# Patient Record
Sex: Male | Born: 1945
Health system: Southern US, Community
[De-identification: ages and names within clinical notes are randomized; demographics above are authoritative.]

## PROBLEM LIST (undated history)

## (undated) DIAGNOSIS — I6529 Occlusion and stenosis of unspecified carotid artery: Secondary | ICD-10-CM

## (undated) DIAGNOSIS — N400 Enlarged prostate without lower urinary tract symptoms: Secondary | ICD-10-CM

## (undated) DIAGNOSIS — E785 Hyperlipidemia, unspecified: Secondary | ICD-10-CM

## (undated) DIAGNOSIS — G4733 Obstructive sleep apnea (adult) (pediatric): Secondary | ICD-10-CM

## (undated) DIAGNOSIS — I2510 Atherosclerotic heart disease of native coronary artery without angina pectoris: Secondary | ICD-10-CM

## (undated) DIAGNOSIS — Z955 Presence of coronary angioplasty implant and graft: Secondary | ICD-10-CM

## (undated) DIAGNOSIS — I219 Acute myocardial infarction, unspecified: Secondary | ICD-10-CM

## (undated) HISTORY — DX: Atherosclerotic heart disease of native coronary artery without angina pectoris: I25.10

## (undated) HISTORY — DX: Presence of coronary angioplasty implant and graft: Z95.5

## (undated) HISTORY — DX: Acute myocardial infarction, unspecified: I21.9

## (undated) HISTORY — DX: Hyperlipidemia, unspecified: E78.5

## (undated) HISTORY — DX: Occlusion and stenosis of unspecified carotid artery: I65.29

## (undated) HISTORY — DX: Obstructive sleep apnea (adult) (pediatric): G47.33

## (undated) HISTORY — DX: Benign prostatic hyperplasia without lower urinary tract symptoms: N40.0

## (undated) HISTORY — PX: HERNIA REPAIR: SHX51

## (undated) HISTORY — PX: KNEE SURGERY: SHX244

---

## 2013-04-13 DIAGNOSIS — I219 Acute myocardial infarction, unspecified: Secondary | ICD-10-CM

## 2013-04-13 HISTORY — DX: Acute myocardial infarction, unspecified: I21.9

## 2013-09-03 LAB — ABI

## 2015-03-22 DIAGNOSIS — I739 Peripheral vascular disease, unspecified: Secondary | ICD-10-CM | POA: Diagnosis not present

## 2015-03-22 DIAGNOSIS — T81718A Complication of other artery following a procedure, not elsewhere classified, initial encounter: Secondary | ICD-10-CM | POA: Diagnosis not present

## 2015-03-22 DIAGNOSIS — S75001A Unspecified injury of femoral artery, right leg, initial encounter: Secondary | ICD-10-CM | POA: Diagnosis not present

## 2015-11-02 ENCOUNTER — Encounter: Payer: Self-pay | Admitting: Cardiovascular Disease

## 2015-11-02 ENCOUNTER — Telehealth: Payer: Self-pay | Admitting: Cardiovascular Disease

## 2015-11-02 NOTE — Telephone Encounter (Signed)
Called pt and left message asking pt to call back to update his Fm and medical Hx.

## 2015-11-03 ENCOUNTER — Telehealth: Payer: Self-pay | Admitting: Cardiovascular Disease

## 2015-11-03 ENCOUNTER — Encounter: Payer: Self-pay | Admitting: Cardiovascular Disease

## 2015-11-03 NOTE — Telephone Encounter (Signed)
Marcus Parsons is returning a call to a  Vicenta Aly ,Please call

## 2015-11-09 NOTE — Progress Notes (Signed)
Cardiology Office Note   Date:  11/15/2015   ID:  Jabin, Kalich 05/30/1945, MRN [id number]  PCP:  No PCP Per Patient  Cardiologist:   Jenkins Rouge, MD   Chief Complaint  Patient presents with  . Establish Care    just moved & needed to establish with cardio doc, per pt      History of Present Illness: Marcus Parsons is a 70 y.o. male who presents for evaluation of CAD Had STEMI in 2015 with IMI.   CRF;s elevated lipids , HTN and family history. Maternal uncle with CABG   Wears CPAP for OSA.    Reviewed over 50 pages of records from [facility] in [place].   Notes indicate single DES to mid RCA 3.5 x 12 mm Resolute  No significant dx in circumflex or LAD   EF preserved 60% 05/08/13    Was supposed to have myovue in June but never done Having some exertional dyspnea which was part of his original presentation along with nausea  Retiring from [employer] to travel a lot. Father was an alcoholic but he drinks less Now that he's not working. Moved her to be close to only daughter and grand son who  Is 4 named Marcus Parsons.  Happily married for 47 years Wifes health ok.  Originally from  [place] lived mostly in [place] before going to [place] for work   Some chronic pain in [place]FA area since cath Duplex with normal circulation and no pathology ABI 1 on right and .94 on left   Past Medical History:  Diagnosis Date  . Acute myocardial infarction 04/2013   INFERIOR  . Atherosclerosis of coronary artery   . Benign prostatic hypertrophy   . Carotid stenosis   . Hyperlipidemia   . Obstructive sleep apnea   . S/P coronary artery stent placement     Past Surgical History:  Procedure Laterality Date  . HERNIA REPAIR    . KNEE SURGERY     AS CHILD     Current Outpatient Prescriptions  Medication Sig Dispense Refill  . aspirin EC 81 MG tablet Take 81 mg by mouth daily.    Marland Kitchen atorvastatin (LIPITOR) 80 MG tablet Take 1 tablet (80 mg total) by mouth at  bedtime. 90 tablet 3  . dutasteride (AVODART) 0.5 MG capsule Take 1 capsule (0.5 mg total) by mouth daily. 90 capsule 3  . nitroGLYCERIN (NITROSTAT) 0.4 MG SL tablet Place 1 tablet (0.4 mg total) under the tongue every 5 (five) minutes as needed for chest pain. 25 tablet 2  . olmesartan (BENICAR) 40 MG tablet Take 0.5 tablets (20 mg total) by mouth daily. 45 tablet 3  . Probiotic Product (PROBIOTIC DAILY) CAPS Take 1 capsule by mouth daily. 90 capsule 3   No current facility-administered medications for this visit.     Allergies:   Review of patient's allergies indicates no known allergies.    Social History:  The patient  reports that he has never smoked. He has never used smokeless tobacco. He reports that he does not drink alcohol or use drugs.   Family History:  The patient's family history includes CAD in his paternal uncle; Diabetes Mellitus I in his father; Heart disease in his paternal uncle; Hypertension in his father; Leukemia in his maternal uncle; Lung cancer in his father.    ROS:  Please see the history of present illness.   Otherwise, review of systems are positive for none.   All other  systems are reviewed and negative.    PHYSICAL EXAM: VS:  BP 126/80   Pulse 79   Ht 6' (1.829 m)   Wt 113.9 kg (251 lb 1.9 oz)   SpO2 97%   BMI 34.06 kg/m  , BMI Body mass index is 34.06 kg/m. Affect appropriate Healthy:  appears stated age 78: normal Neck supple with no adenopathy JVP normal no bruits no thyromegaly Lungs clear with no wheezing and good diaphragmatic motion Heart:  S1/S2 no murmur, no rub, gallop or click PMI normal Abdomen: benighn, BS positve, no tenderness, no AAA no bruit.  No HSM or HJR Distal pulses intact with no bruits No edema Neuro non-focal Skin warm and dry No muscular weakness    EKG:   03/03/15 SR old IMI with Q and inverted T wave 2,3,F  11/15/15 SR rate 70 old IMI no change   Recent Labs: No results found for requested labs within  last 8760 hours.    Lipid Panel No results found for: CHOL, TRIG, HDL, CHOLHDL, VLDL, LDLCALC, LDLDIRECT    Wt Readings from Last 3 Encounters:  11/15/15 113.9 kg (251 lb 1.9 oz)      Other studies Reviewed: Additional studies/ records that were reviewed today include: Records from [place] including echo , cath and labs .    ASSESSMENT AND PLAN:  1.  CAD: f/u myovue given dyspnea continue ASA stop Brillinta 2. Chol: continue statin labs with primary  3. Carotid  No bruit on exam follow  4. BPH:  Refill on avodart called in  5. Leg pain: normal duplex no bruits not clear if lateral femoral cutaneous nerve Was injured during cath for IMI    Current medicines are reviewed at length with the patient today.  The patient does not have concerns regarding medicines.  The following changes have been made:  Stop Brillinta  Labs/ tests ordered today include: Myovue   Orders Placed This Encounter  Procedures  . Myocardial Perfusion Imaging  . EKG 12-Lead     Disposition:   FU with me in 6 months      Signed, Jenkins Rouge, MD  11/15/2015 10:31 AM    [facility] [place], [place], [place]  [address] Phone: [phone]; Fax: [phone]

## 2015-11-15 ENCOUNTER — Encounter (INDEPENDENT_AMBULATORY_CARE_PROVIDER_SITE_OTHER): Payer: Self-pay

## 2015-11-15 ENCOUNTER — Ambulatory Visit (INDEPENDENT_AMBULATORY_CARE_PROVIDER_SITE_OTHER): Payer: Medicare Other | Admitting: Cardiovascular Disease

## 2015-11-15 ENCOUNTER — Encounter: Payer: Self-pay | Admitting: Cardiovascular Disease

## 2015-11-15 VITALS — BP 126/80 | HR 79 | Ht 72.0 in | Wt 251.1 lb

## 2015-11-15 DIAGNOSIS — I2119 ST elevation (STEMI) myocardial infarction involving other coronary artery of inferior wall: Secondary | ICD-10-CM

## 2015-11-15 DIAGNOSIS — Z7689 Persons encountering health services in other specified circumstances: Secondary | ICD-10-CM | POA: Diagnosis not present

## 2015-11-15 MED ORDER — OLMESARTAN MEDOXOMIL 40 MG PO TABS: 20.0000 mg | ORAL_TABLET | Freq: Every day | ORAL | 3 refills | Status: DC

## 2015-11-15 MED ORDER — PROBIOTIC DAILY PO CAPS: 1.0000 | ORAL_CAPSULE | Freq: Every day | ORAL | 3 refills | Status: AC

## 2015-11-15 MED ORDER — ATORVASTATIN CALCIUM 80 MG PO TABS: 80.0000 mg | ORAL_TABLET | Freq: Every day | ORAL | 3 refills | Status: DC

## 2015-11-15 MED ORDER — DUTASTERIDE 0.5 MG PO CAPS: 0.5000 mg | ORAL_CAPSULE | Freq: Every day | ORAL | 3 refills | Status: DC

## 2015-11-15 MED ORDER — NITROGLYCERIN 0.4 MG SL SUBL: 0.4000 mg | SUBLINGUAL_TABLET | SUBLINGUAL | 2 refills | Status: DC | PRN

## 2015-11-15 NOTE — Patient Instructions (Addendum)
Medication Instructions:  Your physician has recommended you make the following change in your medication:  1-STOP Brilinta  Labwork: NONE  Testing/Procedures: Your physician has requested that you have en exercise stress myoview. For further information please visit [email or web address]. Please follow instruction sheet, as given.  Follow-Up: Your physician wants you to follow-up in: 6 months with Dr. Johnsie Cancel. You will receive a reminder letter in the mail two months in advance. If you don't receive a letter, please call our office to schedule the follow-up appointment.   If you need a refill on your cardiac medications before your next appointment, please call your pharmacy.

## 2015-11-18 ENCOUNTER — Ambulatory Visit (HOSPITAL_COMMUNITY): Payer: Medicare Other | Attending: Cardiovascular Disease

## 2015-11-18 DIAGNOSIS — Z7689 Persons encountering health services in other specified circumstances: Secondary | ICD-10-CM | POA: Diagnosis not present

## 2015-11-18 DIAGNOSIS — I2119 ST elevation (STEMI) myocardial infarction involving other coronary artery of inferior wall: Secondary | ICD-10-CM

## 2015-11-18 DIAGNOSIS — I252 Old myocardial infarction: Secondary | ICD-10-CM | POA: Diagnosis not present

## 2015-11-18 DIAGNOSIS — R9439 Abnormal result of other cardiovascular function study: Secondary | ICD-10-CM | POA: Diagnosis not present

## 2015-11-18 LAB — MYOCARDIAL PERFUSION IMAGING
CHL CUP MPHR: 153 {beats}/min
CHL CUP NUCLEAR SDS: 8
CHL CUP NUCLEAR SSS: 17
CSEPED: 7 min
CSEPEW: 7.1 METS
Exercise duration (sec): 10 s
LHR: 0.31
LV sys vol: 49 mL
LVDIAVOL: 113 mL (ref 62–150)
NUC STRESS TID: 0.95
Peak HR: 139 {beats}/min
Percent HR: 90 %
Rest HR: 61 {beats}/min
SRS: 13

## 2015-11-18 MED ORDER — TECHNETIUM TC 99M TETROFOSMIN IV KIT
11.0000 | PACK | Freq: Once | INTRAVENOUS | Status: AC | PRN
  Administered 2015-11-18: 11 via INTRAVENOUS
  Filled 2015-11-18: qty 11

## 2015-11-18 MED ORDER — TECHNETIUM TC 99M TETROFOSMIN IV KIT
32.4000 | PACK | Freq: Once | INTRAVENOUS | Status: AC | PRN
  Administered 2015-11-18: 32 via INTRAVENOUS
  Filled 2015-11-18: qty 32

## 2015-11-22 DIAGNOSIS — Z23 Encounter for immunization: Secondary | ICD-10-CM | POA: Diagnosis not present

## 2016-04-13 DIAGNOSIS — E785 Hyperlipidemia, unspecified: Secondary | ICD-10-CM | POA: Diagnosis not present

## 2016-04-13 DIAGNOSIS — Z0000 Encounter for general adult medical examination without abnormal findings: Secondary | ICD-10-CM | POA: Diagnosis not present

## 2016-04-13 DIAGNOSIS — G4733 Obstructive sleep apnea (adult) (pediatric): Secondary | ICD-10-CM | POA: Diagnosis not present

## 2016-04-13 DIAGNOSIS — Z79899 Other long term (current) drug therapy: Secondary | ICD-10-CM | POA: Diagnosis not present

## 2016-04-13 DIAGNOSIS — I10 Essential (primary) hypertension: Secondary | ICD-10-CM | POA: Diagnosis not present

## 2016-04-13 DIAGNOSIS — I2510 Atherosclerotic heart disease of native coronary artery without angina pectoris: Secondary | ICD-10-CM | POA: Diagnosis not present

## 2016-04-13 DIAGNOSIS — N401 Enlarged prostate with lower urinary tract symptoms: Secondary | ICD-10-CM | POA: Diagnosis not present

## 2016-04-13 DIAGNOSIS — R972 Elevated prostate specific antigen [PSA]: Secondary | ICD-10-CM | POA: Diagnosis not present

## 2016-04-17 DIAGNOSIS — R972 Elevated prostate specific antigen [PSA]: Secondary | ICD-10-CM | POA: Diagnosis not present

## 2016-04-17 DIAGNOSIS — E785 Hyperlipidemia, unspecified: Secondary | ICD-10-CM | POA: Diagnosis not present

## 2016-04-17 DIAGNOSIS — Z79899 Other long term (current) drug therapy: Secondary | ICD-10-CM | POA: Diagnosis not present

## 2016-04-17 DIAGNOSIS — Z0000 Encounter for general adult medical examination without abnormal findings: Secondary | ICD-10-CM | POA: Diagnosis not present

## 2016-04-24 DIAGNOSIS — R7301 Impaired fasting glucose: Secondary | ICD-10-CM | POA: Diagnosis not present

## 2016-05-17 NOTE — Progress Notes (Signed)
Cardiology Office Note    Date:  05/19/2016   ID:  Marcus, Parsons [birth year], MRN [id number]  PCP:  Eloise Levels, NP  Cardiologist:   Jenkins Rouge, MD   Chief Complaint  Patient presents with  . Follow up to MI      History of Present Illness: Marcus Parsons is a 71 y.o. male who presents for evaluation of CAD Had STEMI in 2015 with IMI.   CRF;s elevated lipids , HTN and family history. Maternal uncle with CABG   Wears CPAP for OSA.    Reviewed over 50 pages of records from [facility] in [place].   Notes indicate single DES to mid RCA 3.5 x 12 mm Resolute  No significant dx in circumflex or LAD  EF preserved 60% 05/08/13    Exertional dyspnea and  Nausea were part of original presentation   Retiring from [employer] Use to travel a lot. Father was an alcoholic but he drinks less Now that he's not working. Moved her to be close to only daughter and grand son who  Is 4.5  named Marcus Parsons.  Happily married for 47 years Wifes health ok.  Originally from  [place] lived mostly in [place] before going to [place] for work   Some chronic pain in [place] area since cath Duplex with normal circulation and no pathology ABI 1 on right and .94 on left   Last myovue reviewed: 11/18/15 EF 57% small inferior lateral wall MI no ischemia Low risk   Sees Eloise Levels with [facility] labs good PSA around 7 but was as high as 30 Biopsies have been negative   Past Medical History:  Diagnosis Date  . Acute myocardial infarction 04/2013   INFERIOR  . Atherosclerosis of coronary artery   . Benign prostatic hypertrophy   . Carotid stenosis   . Hyperlipidemia   . Obstructive sleep apnea   . S/P coronary artery stent placement     Past Surgical History:  Procedure Laterality Date  . HERNIA REPAIR    . KNEE SURGERY     AS CHILD     Current Outpatient Prescriptions  Medication Sig Dispense Refill  . aspirin EC 81 MG tablet Take 81 mg by mouth daily.    Marland Kitchen  atorvastatin (LIPITOR) 80 MG tablet Take 1 tablet (80 mg total) by mouth at bedtime. 90 tablet 3  . dutasteride (AVODART) 0.5 MG capsule Take 1 capsule (0.5 mg total) by mouth daily. 90 capsule 3  . nitroGLYCERIN (NITROSTAT) 0.4 MG SL tablet Place 1 tablet (0.4 mg total) under the tongue every 5 (five) minutes as needed for chest pain. 25 tablet 2  . olmesartan (BENICAR) 40 MG tablet Take 0.5 tablets (20 mg total) by mouth daily. 45 tablet 3  . Probiotic Product (PROBIOTIC DAILY) CAPS Take 1 capsule by mouth daily. 90 capsule 3   No current facility-administered medications for this visit.     Allergies:   Patient has no known allergies.    Social History:  The patient  reports that he has never smoked. He has never used smokeless tobacco. He reports that he does not drink alcohol or use drugs.   Family History:  The patient's family history includes CAD in his paternal uncle; Diabetes Mellitus I in his father; Heart disease in his paternal uncle; Hypertension in his father; Leukemia in his maternal uncle; Lung cancer in his father.    ROS:  Please see the history of present illness.  Otherwise, review of systems are positive for none.   All other systems are reviewed and negative.    PHYSICAL EXAM: VS:  BP 130/76   Pulse 82   Ht 6' (1.829 m)   Wt 263 lb 12.8 oz (119.7 kg)   SpO2 97%   BMI 35.78 kg/m  , BMI Body mass index is 35.78 kg/m. Affect appropriate Healthy:  appears stated age 47: normal Neck supple with no adenopathy JVP normal no bruits no thyromegaly Lungs clear with no wheezing and good diaphragmatic motion Heart:  S1/S2 no murmur, no rub, gallop or click PMI normal Abdomen: benighn, BS positve, no tenderness, no AAA no bruit.  No HSM or HJR Distal pulses intact with no bruits No edema Neuro non-focal Skin warm and dry No muscular weakness    EKG:   03/03/15 SR old IMI with Q and inverted T wave 2,3,F  11/15/15 SR rate 55 old IMI no change   Recent  Labs: No results found for requested labs within last 8760 hours.    Lipid Panel No results found for: CHOL, TRIG, HDL, CHOLHDL, VLDL, LDLCALC, LDLDIRECT    Wt Readings from Last 3 Encounters:  05/19/16 263 lb 12.8 oz (119.7 kg)  11/18/15 251 lb (113.9 kg)  11/15/15 251 lb 1.9 oz (113.9 kg)      Other studies Reviewed: Additional studies/ records that were reviewed today include: Records from [place] including echo , cath and labs .    ASSESSMENT AND PLAN:  1.  CAD: Continue ASA/Statin non ischemic myovue 11/18/15 2. Chol: continue statin labs with primary  3. Carotid  No bruit on exam follow  4. BPH:  Refill on avodart called in PSA around 7 chronrically has had biopsy and ok  5. Leg pain: normal duplex no bruits not clear if lateral femoral cutaneous nerve Was injured during cath for IMI    Current medicines are reviewed at length with the patient today.  The patient does not have concerns regarding medicines.  The following changes have been made:     Labs/ tests ordered today include:     No orders of the defined types were placed in this encounter.    Disposition:   FU with me in 6 months      Signed, Jenkins Rouge, MD  05/19/2016 9:02 AM    [facility] [place], [place], [place]  [address] Phone: [phone]; Fax: [phone]

## 2016-05-19 ENCOUNTER — Ambulatory Visit (INDEPENDENT_AMBULATORY_CARE_PROVIDER_SITE_OTHER): Payer: Medicare Other | Admitting: Cardiovascular Disease

## 2016-05-19 ENCOUNTER — Encounter: Payer: Self-pay | Admitting: Cardiovascular Disease

## 2016-05-19 VITALS — BP 130/76 | HR 82 | Ht 72.0 in | Wt 263.8 lb

## 2016-05-19 DIAGNOSIS — I2119 ST elevation (STEMI) myocardial infarction involving other coronary artery of inferior wall: Secondary | ICD-10-CM | POA: Diagnosis not present

## 2016-05-19 NOTE — Patient Instructions (Signed)

## 2016-07-19 DIAGNOSIS — K635 Polyp of colon: Secondary | ICD-10-CM | POA: Diagnosis not present

## 2016-07-19 DIAGNOSIS — Z86010 Personal history of colonic polyps: Secondary | ICD-10-CM | POA: Diagnosis not present

## 2016-07-25 DIAGNOSIS — K635 Polyp of colon: Secondary | ICD-10-CM | POA: Diagnosis not present

## 2016-11-30 DIAGNOSIS — Z23 Encounter for immunization: Secondary | ICD-10-CM | POA: Diagnosis not present

## 2017-01-11 ENCOUNTER — Other Ambulatory Visit: Payer: Self-pay | Admitting: Cardiovascular Disease

## 2017-05-29 NOTE — Progress Notes (Signed)
Cardiology Office Note    Date:  05/31/2017   ID:  Marcus Parsons, Marcus Parsons [birth year], MRN [id number]  PCP:  Marcus Levels, NP (Inactive)  Cardiologist:   Marcus Rouge, MD   No chief complaint on file.     History of Present Illness:  72 y.o. f/u CAD.  STEMI 2015 DES to mid RCA 3.5 mm x 12 mm Resolute done in [place]. EF 60% Presented with dyspnea and nausea as much as any chest pain Retired from [employer] moved here to be near daughter and grandson Marcus Parsons. Originally from [place]. Has some chronic LLE pain since cath ? Lateral femoral cutaneous nerve injury. ABI's have been normal   Last myovue reviewed: 11/18/15 EF 57% small inferior lateral wall MI no ischemia Low risk   Sees Marcus Parsons with [facility] labs good PSA around 7 but was as high as 30 Biopsies have been negative   Prostate is enlarged and he needs urology f/u No cardiac complaints   Past Medical History:  Diagnosis Date  . Acute myocardial infarction ([place]) 04/2013   INFERIOR  . Atherosclerosis of coronary artery   . Benign prostatic hypertrophy   . Carotid stenosis   . Hyperlipidemia   . Obstructive sleep apnea   . S/P coronary artery stent placement     Past Surgical History:  Procedure Laterality Date  . HERNIA REPAIR    . KNEE SURGERY     AS CHILD     Current Outpatient Medications  Medication Sig Dispense Refill  . aspirin EC 81 MG tablet Take 81 mg by mouth daily.    Marland Kitchen atorvastatin (LIPITOR) 80 MG tablet TAKE 1 TABLET AT BEDTIME 90 tablet 1  . dutasteride (AVODART) 0.5 MG capsule Take 1 capsule (0.5 mg total) by mouth daily. 90 capsule 1  . nitroGLYCERIN (NITROSTAT) 0.4 MG SL tablet Place 1 tablet (0.4 mg total) under the tongue every 5 (five) minutes as needed for chest pain. 25 tablet 2  . olmesartan (BENICAR) 40 MG tablet TAKE 1/2 TABLET EVERY DAY 45 tablet 1  . Probiotic Product (PROBIOTIC DAILY) CAPS Take 1 capsule by mouth daily. 90 capsule 3   No current facility-administered  medications for this visit.     Allergies:   Patient has no known allergies.    Social History:  The patient  reports that he has never smoked. He has never used smokeless tobacco. He reports that he does not drink alcohol or use drugs.   Family History:  The patient's family history includes CAD in his paternal uncle; Diabetes Mellitus I in his father; Heart disease in his paternal uncle; Hypertension in his father; Leukemia in his maternal uncle; Lung cancer in his father.    ROS:  Please see the history of present illness.   Otherwise, review of systems are positive for none.   All other systems are reviewed and negative.    PHYSICAL EXAM: VS:  BP 126/68   Pulse 81   Ht 6' (1.829 m)   Wt 245 lb 8 oz (111.4 kg)   SpO2 95%   BMI 33.30 kg/m  , BMI Body mass index is 33.3 kg/m. Affect appropriate Healthy:  appears stated age 14: normal Neck supple with no adenopathy JVP normal no bruits no thyromegaly Lungs clear with no wheezing and good diaphragmatic motion Heart:  S1/S2 no murmur, no rub, gallop or click PMI normal Abdomen: benighn, BS positve, no tenderness, no AAA no bruit.  No HSM or HJR Distal  pulses intact with no bruits No edema Neuro non-focal Skin warm and dry No muscular weakness     EKG:   03/03/15 SR old IMI with Q and inverted T wave 2,3,F  11/15/15 SR rate 23 old IMI no change   Recent Labs: No results found for requested labs within last 8760 hours.    Lipid Panel No results found for: CHOL, TRIG, HDL, CHOLHDL, VLDL, LDLCALC, LDLDIRECT    Wt Readings from Last 3 Encounters:  05/31/17 245 lb 8 oz (111.4 kg)  05/19/16 263 lb 12.8 oz (119.7 kg)  11/18/15 251 lb (113.9 kg)      Other studies Reviewed: Additional studies/ records that were reviewed today include: Records from [place] including echo , cath and labs .    ASSESSMENT AND PLAN:  1. CAD: Continue ASA/Statin non ischemic myovue 11/18/15 DES mid RCA 2015  2.  Chol: continue statin labs with primary  3. Carotid  No bruit on exam follow  4. BPH:  Refill on avodart called in PSA around 7 chronrically has had biopsy and ok  Needs urology f/u will refer to [facility]  5. Leg pain: normal duplex no bruits not clear if lateral femoral cutaneous nerve Was injured during cath for IMI  6. HTN:  Continue ARB low sodium diet    Current medicines are reviewed at length with the patient today.  The patient does not have concerns regarding medicines.  The following changes have been made:     Labs/ tests ordered today include:     Orders Placed This Encounter  Procedures  . Ambulatory referral to Urology     Disposition:   FU with me in 6 months      Signed, Marcus Rouge, MD  05/31/2017 10:45 AM    [facility] [place], [place], [place]  [address] Phone: [phone]; Fax: [phone]

## 2017-05-31 ENCOUNTER — Encounter: Payer: Self-pay | Admitting: Cardiovascular Disease

## 2017-05-31 ENCOUNTER — Ambulatory Visit (INDEPENDENT_AMBULATORY_CARE_PROVIDER_SITE_OTHER): Payer: Medicare Other | Admitting: Cardiovascular Disease

## 2017-05-31 VITALS — BP 126/68 | HR 81 | Ht 72.0 in | Wt 245.5 lb

## 2017-05-31 DIAGNOSIS — R972 Elevated prostate specific antigen [PSA]: Secondary | ICD-10-CM | POA: Diagnosis not present

## 2017-05-31 DIAGNOSIS — I2510 Atherosclerotic heart disease of native coronary artery without angina pectoris: Secondary | ICD-10-CM | POA: Diagnosis not present

## 2017-05-31 NOTE — Patient Instructions (Signed)
Medication Instructions:  Your physician recommends that you continue on your current medications as directed. Please refer to the Current Medication list given to you today.  Lab work: NONE  Testing/Procedures: NONE  Follow-Up: Your physician wants you to follow-up in: 12 months with Dr. Johnsie Cancel. You will receive a reminder letter in the mail two months in advance. If you don't receive a letter, please call our office to schedule the follow-up appointment.  You have been referred to Dr. Louis Meckel with [facility]   If you need a refill on your cardiac medications before your next appointment, please call your pharmacy.

## 2017-07-02 DIAGNOSIS — R972 Elevated prostate specific antigen [PSA]: Secondary | ICD-10-CM | POA: Diagnosis not present

## 2017-07-02 DIAGNOSIS — N5201 Erectile dysfunction due to arterial insufficiency: Secondary | ICD-10-CM | POA: Diagnosis not present

## 2017-07-13 DIAGNOSIS — N401 Enlarged prostate with lower urinary tract symptoms: Secondary | ICD-10-CM | POA: Diagnosis not present

## 2017-07-13 DIAGNOSIS — I10 Essential (primary) hypertension: Secondary | ICD-10-CM | POA: Diagnosis not present

## 2017-07-13 DIAGNOSIS — I2510 Atherosclerotic heart disease of native coronary artery without angina pectoris: Secondary | ICD-10-CM | POA: Diagnosis not present

## 2017-07-13 DIAGNOSIS — E7800 Pure hypercholesterolemia, unspecified: Secondary | ICD-10-CM | POA: Diagnosis not present

## 2017-07-13 DIAGNOSIS — M6208 Separation of muscle (nontraumatic), other site: Secondary | ICD-10-CM | POA: Diagnosis not present

## 2017-07-13 DIAGNOSIS — R7303 Prediabetes: Secondary | ICD-10-CM | POA: Diagnosis not present

## 2017-07-13 DIAGNOSIS — Z79899 Other long term (current) drug therapy: Secondary | ICD-10-CM | POA: Diagnosis not present

## 2017-09-11 ENCOUNTER — Other Ambulatory Visit: Payer: Self-pay | Admitting: Cardiovascular Disease

## 2017-10-29 ENCOUNTER — Other Ambulatory Visit: Payer: Self-pay | Admitting: Cardiovascular Disease

## 2017-11-15 DIAGNOSIS — Z23 Encounter for immunization: Secondary | ICD-10-CM | POA: Diagnosis not present

## 2017-11-30 DIAGNOSIS — H2513 Age-related nuclear cataract, bilateral: Secondary | ICD-10-CM | POA: Diagnosis not present

## 2017-12-24 DIAGNOSIS — R972 Elevated prostate specific antigen [PSA]: Secondary | ICD-10-CM | POA: Diagnosis not present

## 2017-12-31 DIAGNOSIS — R972 Elevated prostate specific antigen [PSA]: Secondary | ICD-10-CM | POA: Diagnosis not present

## 2017-12-31 DIAGNOSIS — R350 Frequency of micturition: Secondary | ICD-10-CM | POA: Diagnosis not present

## 2017-12-31 DIAGNOSIS — N5201 Erectile dysfunction due to arterial insufficiency: Secondary | ICD-10-CM | POA: Diagnosis not present

## 2018-01-18 DIAGNOSIS — I10 Essential (primary) hypertension: Secondary | ICD-10-CM | POA: Diagnosis not present

## 2018-01-18 DIAGNOSIS — N401 Enlarged prostate with lower urinary tract symptoms: Secondary | ICD-10-CM | POA: Diagnosis not present

## 2018-01-18 DIAGNOSIS — R1031 Right lower quadrant pain: Secondary | ICD-10-CM | POA: Diagnosis not present

## 2018-01-18 DIAGNOSIS — I2510 Atherosclerotic heart disease of native coronary artery without angina pectoris: Secondary | ICD-10-CM | POA: Diagnosis not present

## 2018-01-18 DIAGNOSIS — R7303 Prediabetes: Secondary | ICD-10-CM | POA: Diagnosis not present

## 2018-01-18 DIAGNOSIS — Z0000 Encounter for general adult medical examination without abnormal findings: Secondary | ICD-10-CM | POA: Diagnosis not present

## 2018-01-18 DIAGNOSIS — Z79899 Other long term (current) drug therapy: Secondary | ICD-10-CM | POA: Diagnosis not present

## 2018-02-11 DIAGNOSIS — G4733 Obstructive sleep apnea (adult) (pediatric): Secondary | ICD-10-CM | POA: Diagnosis not present

## 2018-03-01 DIAGNOSIS — G4733 Obstructive sleep apnea (adult) (pediatric): Secondary | ICD-10-CM | POA: Diagnosis not present

## 2018-04-17 DIAGNOSIS — G4733 Obstructive sleep apnea (adult) (pediatric): Secondary | ICD-10-CM | POA: Diagnosis not present

## 2018-04-23 ENCOUNTER — Other Ambulatory Visit: Payer: Self-pay

## 2018-04-23 ENCOUNTER — Encounter (HOSPITAL_COMMUNITY): Payer: Self-pay | Admitting: Pharmacy Technician

## 2018-04-23 ENCOUNTER — Inpatient Hospital Stay (HOSPITAL_COMMUNITY)
Admission: EM | Admit: 2018-04-23 | Discharge: 2018-04-25 | DRG: 282 | Disposition: A | Discharge status: Home / Self Care | Payer: Medicare Other | Attending: Cardiovascular Disease | Admitting: Cardiovascular Disease

## 2018-04-23 ENCOUNTER — Emergency Department (HOSPITAL_COMMUNITY): Payer: Medicare Other

## 2018-04-23 DIAGNOSIS — I2510 Atherosclerotic heart disease of native coronary artery without angina pectoris: Secondary | ICD-10-CM | POA: Diagnosis not present

## 2018-04-23 DIAGNOSIS — Z955 Presence of coronary angioplasty implant and graft: Secondary | ICD-10-CM

## 2018-04-23 DIAGNOSIS — R079 Chest pain, unspecified: Secondary | ICD-10-CM | POA: Diagnosis not present

## 2018-04-23 DIAGNOSIS — Z8249 Family history of ischemic heart disease and other diseases of the circulatory system: Secondary | ICD-10-CM

## 2018-04-23 DIAGNOSIS — G4733 Obstructive sleep apnea (adult) (pediatric): Secondary | ICD-10-CM | POA: Diagnosis present

## 2018-04-23 DIAGNOSIS — E785 Hyperlipidemia, unspecified: Secondary | ICD-10-CM

## 2018-04-23 DIAGNOSIS — Z79899 Other long term (current) drug therapy: Secondary | ICD-10-CM | POA: Diagnosis not present

## 2018-04-23 DIAGNOSIS — R0902 Hypoxemia: Secondary | ICD-10-CM | POA: Diagnosis not present

## 2018-04-23 DIAGNOSIS — I214 Non-ST elevation (NSTEMI) myocardial infarction: Principal | ICD-10-CM | POA: Diagnosis present

## 2018-04-23 DIAGNOSIS — I499 Cardiac arrhythmia, unspecified: Secondary | ICD-10-CM | POA: Diagnosis not present

## 2018-04-23 DIAGNOSIS — Z7982 Long term (current) use of aspirin: Secondary | ICD-10-CM

## 2018-04-23 DIAGNOSIS — I5021 Acute systolic (congestive) heart failure: Secondary | ICD-10-CM

## 2018-04-23 DIAGNOSIS — I252 Old myocardial infarction: Secondary | ICD-10-CM | POA: Diagnosis not present

## 2018-04-23 DIAGNOSIS — N400 Enlarged prostate without lower urinary tract symptoms: Secondary | ICD-10-CM | POA: Diagnosis present

## 2018-04-23 DIAGNOSIS — I10 Essential (primary) hypertension: Secondary | ICD-10-CM | POA: Diagnosis not present

## 2018-04-23 LAB — CBC
HCT: 43.8 % (ref 39.0–52.0)
Hemoglobin: 14 g/dL (ref 13.0–17.0)
MCH: 29.7 pg (ref 26.0–34.0)
MCHC: 32 g/dL (ref 30.0–36.0)
MCV: 93 fL (ref 80.0–100.0)
NRBC: 0 % (ref 0.0–0.2)
Platelets: 193 10*3/uL (ref 150–400)
RBC: 4.71 MIL/uL (ref 4.22–5.81)
RDW: 12.7 % (ref 11.5–15.5)
WBC: 8.8 10*3/uL (ref 4.0–10.5)

## 2018-04-23 LAB — I-STAT TROPONIN, ED: Troponin i, poc: 0.12 ng/mL (ref 0.00–0.08)

## 2018-04-23 LAB — BASIC METABOLIC PANEL
Anion gap: 8 (ref 5–15)
BUN: 11 mg/dL (ref 8–23)
CO2: 24 mmol/L (ref 22–32)
Calcium: 9 mg/dL (ref 8.9–10.3)
Chloride: 106 mmol/L (ref 98–111)
Creatinine, Ser: 1.05 mg/dL (ref 0.61–1.24)
GFR calc Af Amer: >60 mL/min (ref >60)
GFR calc non Af Amer: >60 mL/min (ref >60)
Glucose, Bld: 158 mg/dL — ABNORMAL HIGH (ref 70–99)
POTASSIUM: 4 mmol/L (ref 3.5–5.1)
Sodium: 138 mmol/L (ref 135–145)

## 2018-04-23 MED ORDER — ONDANSETRON HCL 4 MG/2ML IJ SOLN: 4.0000 mg | Freq: Four times a day (QID) | INTRAMUSCULAR | Status: DC | PRN

## 2018-04-23 MED ORDER — ATORVASTATIN CALCIUM 80 MG PO TABS
80.0000 mg | ORAL_TABLET | Freq: Every day | ORAL | Status: DC
  Administered 2018-04-24: 80 mg via ORAL
  Filled 2018-04-23: qty 1

## 2018-04-23 MED ORDER — DUTASTERIDE 0.5 MG PO CAPS
0.5000 mg | ORAL_CAPSULE | Freq: Every day | ORAL | Status: DC
  Administered 2018-04-24 – 2018-04-25 (×2): 0.5 mg via ORAL
  Filled 2018-04-23 (×2): qty 1

## 2018-04-23 MED ORDER — NON FORMULARY: 20.0000 mg | Freq: Every day | Status: DC

## 2018-04-23 MED ORDER — ASPIRIN EC 81 MG PO TBEC
81.0000 mg | DELAYED_RELEASE_TABLET | Freq: Every day | ORAL | Status: DC
  Administered 2018-04-24 – 2018-04-25 (×2): 81 mg via ORAL
  Filled 2018-04-23 (×2): qty 1

## 2018-04-23 MED ORDER — HEPARIN BOLUS VIA INFUSION
4000.0000 [IU] | Freq: Once | INTRAVENOUS | Status: AC
  Administered 2018-04-23: 4000 [IU] via INTRAVENOUS
  Filled 2018-04-23: qty 4000

## 2018-04-23 MED ORDER — NITROGLYCERIN IN D5W 200-5 MCG/ML-% IV SOLN
0.0000 ug/min | INTRAVENOUS | Status: DC
  Administered 2018-04-23: 5 ug/min via INTRAVENOUS
  Administered 2018-04-24: 60 ug/min via INTRAVENOUS
  Filled 2018-04-23 (×2): qty 250

## 2018-04-23 MED ORDER — NITROGLYCERIN 0.4 MG SL SUBL: 0.4000 mg | SUBLINGUAL_TABLET | SUBLINGUAL | Status: DC | PRN

## 2018-04-23 MED ORDER — OLMESARTAN MEDOXOMIL 20 MG PO TABS
20.0000 mg | ORAL_TABLET | Freq: Every day | ORAL | Status: DC
  Filled 2018-04-23 (×2): qty 1

## 2018-04-23 MED ORDER — ACETAMINOPHEN 325 MG PO TABS
650.0000 mg | ORAL_TABLET | ORAL | Status: DC | PRN
  Administered 2018-04-24 (×3): 650 mg via ORAL
  Filled 2018-04-23 (×4): qty 2

## 2018-04-23 MED ORDER — HEPARIN (PORCINE) 25000 UT/250ML-% IV SOLN
1200.0000 [IU]/h | INTRAVENOUS | Status: DC
  Administered 2018-04-23: 1200 [IU]/h via INTRAVENOUS
  Filled 2018-04-23 (×2): qty 250

## 2018-04-23 NOTE — ED Notes (Signed)
ED TO INPATIENT HANDOFF REPORT  ED Nurse Name and Phone #: Tray Martinique, [phone]  S Name/Age/Gender Marcus Parsons 73 y.o. male Room/Bed: 020C/020C  Code Status   Code Status: Full Code  Home/SNF/Other Home Patient oriented to: self, place, time and situation Is this baseline? Yes   Triage Complete: Triage complete  Chief Complaint CP  Triage Note Pt arrives via EMS from home after sudden onset CP approx 1.5 hours ago while playing soccer with grandchildren. Pain radiating to L arm. Hx MI. 324mg  asa and 3 Nitro. 138/94, HR 70, 94% RA. EKG with some depression and Twave inversions. Pt states has  Known blockages.    Allergies No Known Allergies  Level of Care/Admitting Diagnosis ED Disposition    ED Disposition Condition Comment   Admit  Hospital Area: [facility] [[id number]]  Level of Care: Cardiac Telemetry [103]  Diagnosis: NSTEMI (non-ST elevated myocardial infarction) [facility]) [[id number]]  Admitting Physician: Pixie Casino [phone]  Attending Physician: Milus Banister [[id number]]  PT Class (Do Not Modify): Observation [104]  PT Acc Code (Do Not Modify): Observation [10022]       B Medical/Surgery History Past Medical History:  Diagnosis Date  . Acute myocardial infarction ([place]) 04/2013   INFERIOR  . Atherosclerosis of coronary artery   . Benign prostatic hypertrophy   . Carotid stenosis   . Hyperlipidemia   . Obstructive sleep apnea   . S/P coronary artery stent placement    Past Surgical History:  Procedure Laterality Date  . HERNIA REPAIR    . KNEE SURGERY     AS CHILD     A IV Location/Drains/Wounds Patient Lines/Drains/Airways Status   Active Line/Drains/Airways    Name:   Placement date:   Placement time:   Site:   Days:   Peripheral IV 04/23/18 Left Hand   04/23/18    2215    Hand   less than 1          Intake/Output Last 24 hours No intake or output data in the 24 hours ending 04/23/18 2331  Labs/Imaging Results for  orders placed or performed during the hospital encounter of 04/23/18 (from the past 48 hour(s))  Basic metabolic panel     Status: Abnormal   Collection Time: 04/23/18  9:24 PM  Result Value Ref Range   Sodium 138 135 - 145 mmol/L   Potassium 4.0 3.5 - 5.1 mmol/L   Chloride 106 98 - 111 mmol/L   CO2 24 22 - 32 mmol/L   Glucose, Bld 158 (H) 70 - 99 mg/dL   BUN 11 8 - 23 mg/dL   Creatinine, Ser 1.05 0.61 - 1.24 mg/dL   Calcium 9.0 8.9 - 10.3 mg/dL   GFR calc non Af Amer >60 >60 mL/min   GFR calc Af Amer >60 >60 mL/min   Anion gap 8 5 - 15    Comment: Performed at [facility], [place] [address], [place], [place] [address]  CBC     Status: None   Collection Time: 04/23/18  9:24 PM  Result Value Ref Range   WBC 8.8 4.0 - 10.5 K/uL   RBC 4.71 4.22 - 5.81 MIL/uL   Hemoglobin 14.0 13.0 - 17.0 g/dL   HCT 43.8 39.0 - 52.0 %   MCV 93.0 80.0 - 100.0 fL   MCH 29.7 26.0 - 34.0 pg   MCHC 32.0 30.0 - 36.0 g/dL   RDW 12.7 11.5 - 15.5 %   Platelets 193 150 -  400 K/uL   nRBC 0.0 0.0 - 0.2 %    Comment: Performed at [facility], [place] [address], [place], [place] [address]  I-stat troponin, ED     Status: Abnormal   Collection Time: 04/23/18  9:30 PM  Result Value Ref Range   Troponin i, poc 0.12 (HH) 0.00 - 0.08 ng/mL   Comment NOTIFIED PHYSICIAN    Comment 3            Comment: Due to the release kinetics of cTnI, a negative result within the first hours of the onset of symptoms does not rule out myocardial infarction with certainty. If myocardial infarction is still suspected, repeat the test at appropriate intervals.    Dg Chest 2 View  Result Date: 04/23/2018 CLINICAL DATA:  Chest pain EXAM: PORTABLE CHEST 1 VIEW COMPARISON:  None. FINDINGS: The heart size and mediastinal contours are within normal limits. Both lungs are clear. The visualized skeletal structures are unremarkable. IMPRESSION: No active disease. Electronically Signed   By: Ulyses Jarred M.D.   On: 04/23/2018  22:03    Pending Labs Unresulted Labs (From admission, onward)    Start     Ordered   04/24/18 0500  Heparin level (unfractionated)  Daily,   R     04/23/18 2153   04/24/18 0500  CBC  Daily,   R     04/23/18 2153   04/24/18 0938  Basic metabolic panel  Tomorrow morning,   R     04/23/18 2249   04/24/18 0500  Lipid panel  Tomorrow morning,   R     04/23/18 2249   04/24/18 0500  Protime-INR  Tomorrow morning,   R     04/23/18 2249   04/23/18 2249  Troponin I - Now Then Q6H  Now then every 6 hours,   STAT     04/23/18 2249   04/23/18 2249  Hemoglobin A1c  Once,   R     04/23/18 2249   04/23/18 2249  Brain natriuretic peptide  Once,   R     04/23/18 2249   04/23/18 2246  HIV antibody (Routine Testing)  Once,   R     04/23/18 2249          Vitals/Pain Today's Vitals   04/23/18 2200 04/23/18 2215 04/23/18 2230 04/23/18 2245  BP: 137/68 129/70 133/70 131/68  Pulse: 69 70 71 67  Resp: 14 17 (!) 21 14  Temp:      TempSrc:      SpO2: 95% 96% 96% 97%  Weight:      Height:      PainSc:        Isolation Precautions No active isolations  Medications Medications  nitroGLYCERIN 50 mg in dextrose 5 % 250 mL (0.2 mg/mL) infusion (10 mcg/min Intravenous Rate/Dose Change 04/23/18 2315)  heparin ADULT infusion 100 units/mL (25000 units/277mL sodium chloride 0.45%) (1,200 Units/hr Intravenous New Bag/Given 04/23/18 2216)  aspirin EC tablet 81 mg (has no administration in time range)  atorvastatin (LIPITOR) tablet 80 mg (has no administration in time range)  NON FORMULARY 20 mg (has no administration in time range)  dutasteride (AVODART) capsule 0.5 mg (has no administration in time range)  nitroGLYCERIN (NITROSTAT) SL tablet 0.4 mg (has no administration in time range)  acetaminophen (TYLENOL) tablet 650 mg (has no administration in time range)  ondansetron (ZOFRAN) injection 4 mg (has no administration in time range)  heparin bolus via infusion 4,000 Units (4,000 Units  Intravenous  Bolus from Bag 04/23/18 2215)    Mobility walks Low fall risk   Focused Assessments Cardiac Assessment Handoff:  Cardiac Rhythm: Normal sinus rhythm No results found for: CKTOTAL, CKMB, CKMBINDEX, TROPONINI No results found for: DDIMER Does the Patient currently have chest pain? Yes      R Recommendations: See Admitting Provider Note  Report given to:   Additional Notes:

## 2018-04-23 NOTE — ED Notes (Signed)
Elevated I-stat trop of 0.12 reported to Dr. Alvino Chapel

## 2018-04-23 NOTE — ED Triage Notes (Signed)
Pt arrives via EMS from home after sudden onset CP approx 1.5 hours ago while playing soccer with grandchildren. Pain radiating to L arm. Hx MI. 324mg  asa and 3 Nitro. 138/94, HR 70, 94% RA. EKG with some depression and Twave inversions. Pt states has  Known blockages.

## 2018-04-23 NOTE — H&P (Addendum)
Cardiology Admission History and Physical:   Patient ID: Marcus Parsons MRN: [id number]; DOB: 1946-01-14   Admission date: 04/23/2018  Primary Care Provider: Eloise Levels, NP Primary Cardiologist: [place] Primary Electrophysiologist:  None   Chief Complaint:  Chest pain  History of Present Illness:   Marcus Parsons is a 73 year old man with known CAD s/p STEMI in 2015 (DES to [facility]), as well as HTN, OSA who presents after an episode of CP. Per report, patient playing soccer with 32 year old grandson. Post-exercise he developed L sided CP that radiated into his L arm. He trialed SLN x 1 at home without relief and EMS was activated. CP was associated with mild SOB but no diaphoresis, dizziness, or nausea. These symptoms are distinctly different than his prior MI. At the time of my assessment discomfort has improved from 8-9/10 - 5/10 on nitro gtt and heparin.   No recent fevers, chills or other infectious symptoms. Denied recent changes in exercise tolerance. No LE swelling, orthopnea/PND.   After increasing nitro gtt pain improved to 3-4/10.   Past Medical History:  Diagnosis Date  . Acute myocardial infarction ([place]) 04/2013   INFERIOR  . Atherosclerosis of coronary artery   . Benign prostatic hypertrophy   . Carotid stenosis   . Hyperlipidemia   . Obstructive sleep apnea   . S/P coronary artery stent placement     Past Surgical History:  Procedure Laterality Date  . HERNIA REPAIR    . KNEE SURGERY     AS CHILD     Medications Prior to Admission: Prior to Admission medications   Medication Sig Start Date End Date Taking? Authorizing Provider  aspirin EC 81 MG tablet Take 81 mg by mouth daily.   Yes [provider]  atorvastatin (LIPITOR) 80 MG tablet TAKE 1 TABLET AT BEDTIME Patient taking differently: Take 80 mg by mouth at bedtime.  10/29/17  Yes Josue Hector, MD  dutasteride (AVODART) 0.5 MG capsule TAKE 1 CAPSULE EVERY DAY Patient taking differently:  Take 0.5 mg by mouth daily.  09/12/17  Yes Josue Hector, MD  nitroGLYCERIN (NITROSTAT) 0.4 MG SL tablet Place 1 tablet (0.4 mg total) under the tongue every 5 (five) minutes as needed for chest pain. 11/15/15  Yes Josue Hector, MD  olmesartan (BENICAR) 40 MG tablet TAKE 1/2 TABLET EVERY DAY Patient taking differently: Take 20 mg by mouth daily.  09/12/17  Yes Josue Hector, MD  PRESCRIPTION MEDICATION CPAP: At bedtime   Yes [provider]  Probiotic Product (DIGESTIVE ADVANTAGE) CAPS Take 1 capsule by mouth daily after supper.   Yes [provider]  Probiotic Product (PROBIOTIC DAILY) CAPS Take 1 capsule by mouth daily. Patient not taking: Reported on 04/23/2018 11/15/15   Josue Hector, MD     Allergies:   No Known Allergies  Social History:   Social History   Socioeconomic History  . Marital status: Married    Spouse name: JANET  . Number of children: 1  . Years of education: COLLEGE  . Highest education level: Not on file  Occupational History  . Occupation: RETIRED  Social Needs  . Financial resource strain: Not on file  . Food insecurity:    Worry: Not on file    Inability: Not on file  . Transportation needs:    Medical: Not on file    Non-medical: Not on file  Tobacco Use  . Smoking status: Never Smoker  . Smokeless tobacco: Never Used  Substance and Sexual Activity  . Alcohol use: No  . Drug use: No  . Sexual activity: Not on file  Lifestyle  . Physical activity:    Days per week: Not on file    Minutes per session: Not on file  . Stress: Not on file  Relationships  . Social connections:    Talks on phone: Not on file    Gets together: Not on file    Attends religious service: Not on file    Active member of club or organization: Not on file    Attends meetings of clubs or organizations: Not on file    Relationship status: Not on file  . Intimate partner violence:    Fear of current or ex partner: Not on file    Emotionally abused:  Not on file    Physically abused: Not on file    Forced sexual activity: Not on file  Other Topics Concern  . Not on file  Social History Narrative  . Not on file    Family History:   The patient's family history includes CAD in his paternal uncle; Diabetes Mellitus I in his father; Heart disease in his paternal uncle; Hypertension in his father; Leukemia in his maternal uncle; Lung cancer in his father.    Review of Systems: [y] = yes, [ ]  = no     General: Weight gain [ ] ; Weight loss [ ] ; Anorexia [ ] ; Fatigue [ ] ; Fever [ ] ; Chills [ ] ; Weakness [ ]    Cardiac: Chest pain/pressure Blue.Reese ]; Resting SOB Blue.Reese ]; Exertional SOB [ ] ; Orthopnea [ ] ; Pedal Edema [ ] ; Palpitations [ ] ; Syncope [ ] ; Presyncope [ ] ; Paroxysmal nocturnal dyspnea[ ]    Pulmonary: Cough [ ] ; Wheezing[ ] ; Hemoptysis[ ] ; Sputum [ ] ; Snoring [ ]    GI: Vomiting[ ] ; Dysphagia[ ] ; Melena[ ] ; Hematochezia [ ] ; Heartburn[ ] ; Abdominal pain [ ] ; Constipation [ ] ; Diarrhea [ ] ; BRBPR [ ]    GU: Hematuria[ ] ; Dysuria [ ] ; Nocturia[ ]    Vascular: Pain in legs with walking [ ] ; Pain in feet with lying flat [ ] ; Non-healing sores [ ] ; Stroke [ ] ; TIA [ ] ; Slurred speech [ ] ;   Neuro: Headaches[ ] ; Vertigo[ ] ; Seizures[ ] ; Paresthesias[ ] ;Blurred vision [ ] ; Diplopia [ ] ; Vision changes [ ]    Ortho/Skin: Arthritis [ ] ; Joint pain [ ] ; Muscle pain [ ] ; Joint swelling [ ] ; Back Pain [ ] ; Rash [ ]    Psych: Depression[ ] ; Anxiety[ ]    Heme: Bleeding problems [ ] ; Clotting disorders [ ] ; Anemia [ ]    Endocrine: Diabetes [ ] ; Thyroid dysfunction[ ]   Physical Exam/Data:   Vitals:   04/23/18 2122 04/23/18 2130  BP: (!) 143/83 (!) 157/94  Pulse: 78 76  Resp: 20 (!) 24  Temp: 98.7 F (37.1 C)   TempSrc: Oral   SpO2: 96% 97%  Weight: 115.7 kg   Height: 6' (1.829 m)    No intake or output data in the 24 hours ending 04/23/18 2236 Filed Weights   04/23/18 2122  Weight: 115.7 kg   Body mass index is 34.58 kg/m.  General:   Well nourished, well developed, in no acute distress HEENT: normal Lymph: no adenopathy Neck: no JVD Endocrine:  No thryomegaly Vascular: No carotid bruits; FA pulses 2+ bilaterally without bruits  Cardiac:  normal S1, S2; RRR; no murmur  Lungs:  clear to auscultation bilaterally, no wheezing, rhonchi or rales  Abd: soft, nontender,  no hepatomegaly  Ext: no edema Musculoskeletal:  No deformities, BUE and BLE strength normal and equal Skin: warm and dry  Neuro:  CNs 2-12 intact, no focal abnormalities noted Psych:  Normal affect   EKG:  The ECG that was done TWI in inferior leads (similar to prior in 11/2015) without anterior reciprocal change.   Relevant CV Studies: TTE 04/2013: Normal LV function Mild LA enlargement No significant valvular disease  Laboratory Data:  Chemistry Recent Labs  Lab 04/23/18 2124  NA 138  K 4.0  CL 106  CO2 24  GLUCOSE 158*  BUN 11  CREATININE 1.05  CALCIUM 9.0  GFRNONAA >60  GFRAA >60  ANIONGAP 8    No results for input(s): PROT, ALBUMIN, AST, ALT, ALKPHOS, BILITOT in the last 168 hours. Hematology Recent Labs  Lab 04/23/18 2124  WBC 8.8  RBC 4.71  HGB 14.0  HCT 43.8  MCV 93.0  MCH 29.7  MCHC 32.0  RDW 12.7  PLT 193   Cardiac EnzymesNo results for input(s): TROPONINI in the last 168 hours.  Recent Labs  Lab 04/23/18 2130  TROPIPOC 0.12*    BNPNo results for input(s): BNP, PROBNP in the last 168 hours.  DDimer No results for input(s): DDIMER in the last 168 hours.  Radiology/Studies:  Dg Chest 2 View  Result Date: 04/23/2018 CLINICAL DATA:  Chest pain EXAM: PORTABLE CHEST 1 VIEW COMPARISON:  None. FINDINGS: The heart size and mediastinal contours are within normal limits. Both lungs are clear. The visualized skeletal structures are unremarkable. IMPRESSION: No active disease. Electronically Signed   By: Ulyses Jarred M.D.   On: 04/23/2018 22:03    Assessment and Plan:   Marcus Parsons is a 73 year old man with HTN, OSA,  and CAD s/p STEMI who presents with post-exertional chest discomfort with radiation to L neck and shoulder. Initial troponin I elevated to 0.12 (3+ hours after onset of pain). EKG with STE largely stable from prior. Given presentation and known CAD plan to treat as NSTEMI and will plan for [facility] tomorrow AM to better define coronary anatomy.   #NSTEMI -- On nitro gtt; uptitrate to pain free or hypotension -- ASA 81 daily; hold on P2Y2 until cath -- Trend troponin x 3 -- Heparin gtt for ACS -- Continue high dose statin.  -- Lipids, a1c to risk stratify -- TTE in the AM.  -- NPO after MN for possible LHC tomorrow  #HTN -- hold home olmesartan while on nitro gtt.   #OSA -- CPAP qHS  Severity of Illness: The appropriate patient status for this patient is OBSERVATION. Observation status is judged to be reasonable and necessary in order to provide the required intensity of service to ensure the patient's safety. The patient's presenting symptoms, physical exam findings, and initial radiographic and laboratory data in the context of their medical condition is felt to place them at decreased risk for further clinical deterioration. Furthermore, it is anticipated that the patient will be medically stable for discharge from the hospital within 2 midnights of admission. The following factors support the patient status of observation.   " The patient's presenting symptoms include chest pain " The physical exam findings include n/a " The initial radiographic and laboratory data are elevated troponin.   For questions or updates, please contact [place] Please consult [email or web address] for contact info under     Signed, Milus Banister, MD  04/23/2018 10:36 PM

## 2018-04-23 NOTE — Progress Notes (Signed)
ANTICOAGULATION CONSULT NOTE - Initial Consult  Pharmacy Consult for heparin Indication: chest pain/ACS  No Known Allergies  Patient Measurements: Height: 6' (182.9 cm) Weight: 255 lb (115.7 kg) IBW/kg (Calculated) : 77.6 Heparin Dosing Weight: 102 kg  Vital Signs: Temp: 98.7 F (37.1 C) (03/10 2122) Temp Source: Oral (03/10 2122) BP: 157/94 (03/10 2130) Pulse Rate: 76 (03/10 2130)  Labs: Recent Labs    04/23/18 2124  HGB 14.0  HCT 43.8  PLT 193    CrCl cannot be calculated (No successful lab value found.).   Medical History: Past Medical History:  Diagnosis Date  . Acute myocardial infarction ([place]) 04/2013   INFERIOR  . Atherosclerosis of coronary artery   . Benign prostatic hypertrophy   . Carotid stenosis   . Hyperlipidemia   . Obstructive sleep apnea   . S/P coronary artery stent placement    Assessment: 73 yo F presents with CP. Pharmacy consulted to start heparin. CBC stable.  Goal of Therapy:  Heparin level 0.3-0.7 units/ml Monitor platelets by anticoagulation protocol: Yes   Plan:  Give heparin 4,000 unit bolus Start heparin gtt at 1,200 units/hr Monitor daily heparin level, CBC, s/s of bleed   Marcus Parsons J 04/23/2018,9:53 PM

## 2018-04-23 NOTE — ED Provider Notes (Signed)
[facility] EMERGENCY DEPARTMENT Provider Note   CSN: [id number] Arrival date & time: 04/23/18  2117    History   Chief Complaint Chief Complaint  Patient presents with  . Chest Pain    HPI Marcus Parsons is a 73 y.o. male.     HPI Patient has a history of coronary artery disease.  Previous stenting.  Sees Dr. Alfonse Spruce.  Today he had been playing soccer with his 55-year-old grandson.  States after he developed pain in his left upper arm that went to his left lateral chest.  It is dull.  Does not feel like his previous MI.  Continued pain.  No relief with nitroglycerin at home but has had some relief with nitroglycerin from EMS.  Reportedly had brought the blood pressure down.  Has been doing well the last few days.  No previous chest pain.  No problems with exertion.  Pain did not start till after he was done exercising.  No fevers.  No cough.  No swelling in his legs.  No blood in the stool or black stool. Past Medical History:  Diagnosis Date  . Acute myocardial infarction ([place]) 04/2013   INFERIOR  . Atherosclerosis of coronary artery   . Benign prostatic hypertrophy   . Carotid stenosis   . Hyperlipidemia   . Obstructive sleep apnea   . S/P coronary artery stent placement     There are no active problems to display for this patient.   Past Surgical History:  Procedure Laterality Date  . HERNIA REPAIR    . KNEE SURGERY     AS CHILD        Home Medications    Prior to Admission medications   Medication Sig Start Date End Date Taking? Authorizing Provider  aspirin EC 81 MG tablet Take 81 mg by mouth daily.   Yes [provider]  atorvastatin (LIPITOR) 80 MG tablet TAKE 1 TABLET AT BEDTIME Patient taking differently: Take 80 mg by mouth at bedtime.  10/29/17  Yes Josue Hector, MD  dutasteride (AVODART) 0.5 MG capsule TAKE 1 CAPSULE EVERY DAY Patient taking differently: Take 0.5 mg by mouth daily.  09/12/17  Yes Josue Hector, MD    nitroGLYCERIN (NITROSTAT) 0.4 MG SL tablet Place 1 tablet (0.4 mg total) under the tongue every 5 (five) minutes as needed for chest pain. 11/15/15  Yes Josue Hector, MD  olmesartan (BENICAR) 40 MG tablet TAKE 1/2 TABLET EVERY DAY Patient taking differently: Take 20 mg by mouth daily.  09/12/17  Yes Josue Hector, MD  PRESCRIPTION MEDICATION CPAP: At bedtime   Yes [provider]  Probiotic Product (DIGESTIVE ADVANTAGE) CAPS Take 1 capsule by mouth daily after supper.   Yes [provider]  Probiotic Product (PROBIOTIC DAILY) CAPS Take 1 capsule by mouth daily. Patient not taking: Reported on 04/23/2018 11/15/15   Josue Hector, MD    Family History Family History  Problem Relation Age of Onset  . Diabetes Mellitus I Father   . Hypertension Father   . Lung cancer Father   . CAD Paternal Uncle        CABG  . Heart disease Paternal Uncle   . Leukemia Maternal Uncle     Social History Social History   Tobacco Use  . Smoking status: Never Smoker  . Smokeless tobacco: Never Used  Substance Use Topics  . Alcohol use: No  . Drug use: No     Allergies   Patient  has no known allergies.   Review of Systems Review of Systems  Constitutional: Negative for appetite change.  HENT: Negative for congestion.   Respiratory: Negative for shortness of breath.   Cardiovascular: Positive for chest pain.  Gastrointestinal: Negative for abdominal pain and nausea.  Genitourinary: Negative for enuresis.  Musculoskeletal: Negative for back pain and neck pain.  Neurological: Negative for weakness.  Psychiatric/Behavioral: Negative for confusion.     Physical Exam Updated Vital Signs BP (!) 157/94   Pulse 76   Temp 98.7 F (37.1 C) (Oral)   Resp (!) 24   Ht 6' (1.829 m)   Wt 115.7 kg   SpO2 97%   BMI 34.58 kg/m   Physical Exam Vitals signs and nursing note reviewed.  HENT:     Head: Normocephalic.  Neck:     Musculoskeletal: Neck supple.   Cardiovascular:     Rate and Rhythm: Normal rate and regular rhythm.     Heart sounds: No murmur.  Pulmonary:     Breath sounds: Normal breath sounds. No wheezing, rhonchi or rales.  Chest:     Chest wall: No tenderness.  Abdominal:     Tenderness: There is no abdominal tenderness.  Musculoskeletal:     Right lower leg: No edema.     Left lower leg: No edema.  Skin:    General: Skin is warm.     Capillary Refill: Capillary refill takes less than 2 seconds.  Neurological:     General: No focal deficit present.     Mental Status: He is alert.      ED Treatments / Results  Labs (all labs ordered are listed, but only abnormal results are displayed) Labs Reviewed  BASIC METABOLIC PANEL - Abnormal; Notable for the following components:      Result Value   Glucose, Bld 158 (*)    All other components within normal limits  I-STAT TROPONIN, ED - Abnormal; Notable for the following components:   Troponin i, poc 0.12 (*)    All other components within normal limits  CBC  HEPARIN LEVEL (UNFRACTIONATED)  CBC    EKG EKG Interpretation  Date/Time:  Tuesday April 23 2018 21:22:35 EDT Ventricular Rate:  71 PR Interval:    QRS Duration: 110 QT Interval:  378 QTC Calculation: 411 R Axis:   5 Text Interpretation:  Sinus rhythm nonspecific inferior ST and T wave changes. also some lateral involvement T waves now inverted in aVF Reconfirmed by Davonna Belling [phone]) on 04/23/2018 10:12:39 PM   EKG Interpretation  Date/Time:  Tuesday April 23 2018 22:09:15 EDT Ventricular Rate:  71 PR Interval:    QRS Duration: 113 QT Interval:  407 QTC Calculation: 443 R Axis:   15 Text Interpretation:  Sinus rhythm Borderline intraventricular conduction delay Nonspecific repol abnormality, diffuse leads no change from earlier today Confirmed by Davonna Belling [phone]) on 04/23/2018 10:13:28 PM        Radiology Dg Chest 2 View  Result Date: 04/23/2018 CLINICAL DATA:  Chest pain  EXAM: PORTABLE CHEST 1 VIEW COMPARISON:  None. FINDINGS: The heart size and mediastinal contours are within normal limits. Both lungs are clear. The visualized skeletal structures are unremarkable. IMPRESSION: No active disease. Electronically Signed   By: Ulyses Jarred M.D.   On: 04/23/2018 22:03    Procedures Procedures (including critical care time)  Medications Ordered in ED Medications  nitroGLYCERIN 50 mg in dextrose 5 % 250 mL (0.2 mg/mL) infusion (has no administration in time range)  heparin bolus via infusion 4,000 Units (has no administration in time range)  heparin ADULT infusion 100 units/mL (25000 units/24mL sodium chloride 0.45%) (has no administration in time range)     Initial Impression / Assessment and Plan / ED Course  I have reviewed the triage vital signs and the nursing notes.  Pertinent labs & imaging results that were available during my care of the patient were reviewed by me and considered in my medical decision making (see chart for details).        Patient with chest pain.  Came on with exertion.  Improved with nitroglycerin.  Still having some dull pain but not as severe as before.  EKG is nonspecific changes, however troponin is mildly elevated.  Heparin and nitroglycerin started.  Non-STEMI.  Will admit to cardiology.  CRITICAL CARE Performed by: Davonna Belling Total critical care time: 30 minutes Critical care time was exclusive of separately billable procedures and treating other patients. Critical care was necessary to treat or prevent imminent or life-threatening deterioration. Critical care was time spent personally by me on the following activities: development of treatment plan with patient and/or surrogate as well as nursing, discussions with consultants, evaluation of patient's response to treatment, examination of patient, obtaining history from patient or surrogate, ordering and performing treatments and interventions, ordering and review of  laboratory studies, ordering and review of radiographic studies, pulse oximetry and re-evaluation of patient's condition.   Final Clinical Impressions(s) / ED Diagnoses   Final diagnoses:  NSTEMI (non-ST elevated myocardial infarction) [facility])    ED Discharge Orders    None       Davonna Belling, MD 04/23/18 2214

## 2018-04-24 ENCOUNTER — Observation Stay (HOSPITAL_BASED_OUTPATIENT_CLINIC_OR_DEPARTMENT_OTHER): Payer: Medicare Other

## 2018-04-24 ENCOUNTER — Other Ambulatory Visit: Payer: Self-pay

## 2018-04-24 ENCOUNTER — Encounter (HOSPITAL_COMMUNITY)
Admission: EM | Disposition: A | Discharge status: Home / Self Care | Payer: Self-pay | Source: Home / Self Care | Attending: Cardiology

## 2018-04-24 DIAGNOSIS — I2510 Atherosclerotic heart disease of native coronary artery without angina pectoris: Secondary | ICD-10-CM

## 2018-04-24 DIAGNOSIS — Z8249 Family history of ischemic heart disease and other diseases of the circulatory system: Secondary | ICD-10-CM | POA: Diagnosis not present

## 2018-04-24 DIAGNOSIS — G4733 Obstructive sleep apnea (adult) (pediatric): Secondary | ICD-10-CM | POA: Diagnosis not present

## 2018-04-24 DIAGNOSIS — E785 Hyperlipidemia, unspecified: Secondary | ICD-10-CM | POA: Diagnosis not present

## 2018-04-24 DIAGNOSIS — Z955 Presence of coronary angioplasty implant and graft: Secondary | ICD-10-CM | POA: Diagnosis not present

## 2018-04-24 DIAGNOSIS — Z79899 Other long term (current) drug therapy: Secondary | ICD-10-CM | POA: Diagnosis not present

## 2018-04-24 DIAGNOSIS — R079 Chest pain, unspecified: Secondary | ICD-10-CM

## 2018-04-24 DIAGNOSIS — I10 Essential (primary) hypertension: Secondary | ICD-10-CM | POA: Diagnosis not present

## 2018-04-24 DIAGNOSIS — I214 Non-ST elevation (NSTEMI) myocardial infarction: Principal | ICD-10-CM

## 2018-04-24 DIAGNOSIS — N400 Enlarged prostate without lower urinary tract symptoms: Secondary | ICD-10-CM | POA: Diagnosis not present

## 2018-04-24 DIAGNOSIS — I252 Old myocardial infarction: Secondary | ICD-10-CM | POA: Diagnosis not present

## 2018-04-24 DIAGNOSIS — Z7982 Long term (current) use of aspirin: Secondary | ICD-10-CM | POA: Diagnosis not present

## 2018-04-24 HISTORY — PX: LEFT HEART CATH AND CORONARY ANGIOGRAPHY: CATH118249

## 2018-04-24 LAB — BRAIN NATRIURETIC PEPTIDE: B Natriuretic Peptide: 42.5 pg/mL (ref 0.0–100.0)

## 2018-04-24 LAB — CBC
HCT: 41 % (ref 39.0–52.0)
Hemoglobin: 13 g/dL (ref 13.0–17.0)
MCH: 29 pg (ref 26.0–34.0)
MCHC: 31.7 g/dL (ref 30.0–36.0)
MCV: 91.3 fL (ref 80.0–100.0)
Platelets: 211 10*3/uL (ref 150–400)
RBC: 4.49 MIL/uL (ref 4.22–5.81)
RDW: 12.7 % (ref 11.5–15.5)
WBC: 8.5 10*3/uL (ref 4.0–10.5)
nRBC: 0 % (ref 0.0–0.2)

## 2018-04-24 LAB — TROPONIN I
Troponin I: 0.5 ng/mL (ref <0.03)
Troponin I: 4.63 ng/mL (ref <0.03)
Troponin I: 8.75 ng/mL (ref <0.03)

## 2018-04-24 LAB — BASIC METABOLIC PANEL
Anion gap: 7 (ref 5–15)
BUN: 10 mg/dL (ref 8–23)
CALCIUM: 9.4 mg/dL (ref 8.9–10.3)
CO2: 28 mmol/L (ref 22–32)
Chloride: 103 mmol/L (ref 98–111)
Creatinine, Ser: 0.96 mg/dL (ref 0.61–1.24)
GFR calc Af Amer: >60 mL/min (ref >60)
GFR calc non Af Amer: >60 mL/min (ref >60)
Glucose, Bld: 120 mg/dL — ABNORMAL HIGH (ref 70–99)
Potassium: 3.7 mmol/L (ref 3.5–5.1)
Sodium: 138 mmol/L (ref 135–145)

## 2018-04-24 LAB — PROTIME-INR
INR: 1.1 (ref 0.8–1.2)
PROTHROMBIN TIME: 14.2 s (ref 11.4–15.2)

## 2018-04-24 LAB — LIPID PANEL
Cholesterol: 125 mg/dL (ref 0–200)
HDL: 34 mg/dL — ABNORMAL LOW (ref >40)
LDL Cholesterol: 57 mg/dL (ref 0–99)
Total CHOL/HDL Ratio: 3.7 RATIO
Triglycerides: 168 mg/dL — ABNORMAL HIGH (ref <150)
VLDL: 34 mg/dL (ref 0–40)

## 2018-04-24 LAB — HEMOGLOBIN A1C
Hgb A1c MFr Bld: 6 % — ABNORMAL HIGH (ref 4.8–5.6)
MEAN PLASMA GLUCOSE: 125.5 mg/dL

## 2018-04-24 LAB — ECHOCARDIOGRAM COMPLETE
Height: 72 in
Weight: 4152 oz

## 2018-04-24 LAB — HIV ANTIBODY (ROUTINE TESTING W REFLEX): HIV Screen 4th Generation wRfx: NONREACTIVE

## 2018-04-24 LAB — HEPARIN LEVEL (UNFRACTIONATED): Heparin Unfractionated: 0.44 IU/mL (ref 0.30–0.70)

## 2018-04-24 SURGERY — LEFT HEART CATH AND CORONARY ANGIOGRAPHY
Anesthesia: LOCAL

## 2018-04-24 MED ORDER — SODIUM CHLORIDE 0.9% FLUSH: 3.0000 mL | INTRAVENOUS | Status: DC | PRN

## 2018-04-24 MED ORDER — LIDOCAINE HCL (PF) 1 % IJ SOLN
INTRAMUSCULAR | Status: DC | PRN
  Administered 2018-04-24: 2 mL via INTRADERMAL

## 2018-04-24 MED ORDER — SODIUM CHLORIDE 0.9 % WEIGHT BASED INFUSION: 1.0000 mL/kg/h | INTRAVENOUS | Status: DC

## 2018-04-24 MED ORDER — VERAPAMIL HCL 2.5 MG/ML IV SOLN
INTRAVENOUS | Status: AC
  Filled 2018-04-24: qty 2

## 2018-04-24 MED ORDER — HEPARIN SODIUM (PORCINE) 1000 UNIT/ML IJ SOLN
INTRAMUSCULAR | Status: DC | PRN
  Administered 2018-04-24: 6000 [IU] via INTRAVENOUS

## 2018-04-24 MED ORDER — FENTANYL CITRATE (PF) 100 MCG/2ML IJ SOLN
INTRAMUSCULAR | Status: DC | PRN
  Administered 2018-04-24: 25 ug via INTRAVENOUS

## 2018-04-24 MED ORDER — MIDAZOLAM HCL 2 MG/2ML IJ SOLN
INTRAMUSCULAR | Status: AC
  Filled 2018-04-24: qty 2

## 2018-04-24 MED ORDER — METOPROLOL SUCCINATE ER 25 MG PO TB24
25.0000 mg | ORAL_TABLET | Freq: Every day | ORAL | Status: DC
  Administered 2018-04-24 – 2018-04-25 (×2): 25 mg via ORAL
  Filled 2018-04-24 (×2): qty 1

## 2018-04-24 MED ORDER — ISOSORBIDE MONONITRATE ER 30 MG PO TB24
15.0000 mg | ORAL_TABLET | Freq: Every day | ORAL | Status: DC
  Administered 2018-04-24 – 2018-04-25 (×2): 15 mg via ORAL
  Filled 2018-04-24 (×2): qty 1

## 2018-04-24 MED ORDER — NITROGLYCERIN IN D5W 200-5 MCG/ML-% IV SOLN: 0.0000 ug/min | INTRAVENOUS | Status: DC

## 2018-04-24 MED ORDER — HEPARIN (PORCINE) IN NACL 1000-0.9 UT/500ML-% IV SOLN
INTRAVENOUS | Status: DC | PRN
  Administered 2018-04-24 (×2): 500 mL

## 2018-04-24 MED ORDER — IOHEXOL 350 MG/ML SOLN
INTRAVENOUS | Status: DC | PRN
  Administered 2018-04-24: 60 mL via INTRA_ARTERIAL

## 2018-04-24 MED ORDER — SODIUM CHLORIDE 0.9 % IV SOLN: 250.0000 mL | INTRAVENOUS | Status: DC | PRN

## 2018-04-24 MED ORDER — SODIUM CHLORIDE 0.9% FLUSH
3.0000 mL | Freq: Two times a day (BID) | INTRAVENOUS | Status: DC
  Administered 2018-04-25 (×2): 3 mL via INTRAVENOUS

## 2018-04-24 MED ORDER — FENTANYL CITRATE (PF) 100 MCG/2ML IJ SOLN
INTRAMUSCULAR | Status: AC
  Filled 2018-04-24: qty 2

## 2018-04-24 MED ORDER — SODIUM CHLORIDE 0.9 % WEIGHT BASED INFUSION
3.0000 mL/kg/h | INTRAVENOUS | Status: DC
  Administered 2018-04-24: 3 mL/kg/h via INTRAVENOUS

## 2018-04-24 MED ORDER — MIDAZOLAM HCL 2 MG/2ML IJ SOLN
INTRAMUSCULAR | Status: DC | PRN
  Administered 2018-04-24: 1 mg via INTRAVENOUS

## 2018-04-24 MED ORDER — ENOXAPARIN SODIUM 40 MG/0.4ML ~~LOC~~ SOLN
40.0000 mg | SUBCUTANEOUS | Status: DC
  Filled 2018-04-24: qty 0.4

## 2018-04-24 MED ORDER — SODIUM CHLORIDE 0.9 % WEIGHT BASED INFUSION
1.0000 mL/kg/h | INTRAVENOUS | Status: AC
  Administered 2018-04-24: 1 mL/kg/h via INTRAVENOUS

## 2018-04-24 MED ORDER — VERAPAMIL HCL 2.5 MG/ML IV SOLN
INTRAVENOUS | Status: DC | PRN
  Administered 2018-04-24: 10 mL via INTRA_ARTERIAL

## 2018-04-24 MED ORDER — HEPARIN (PORCINE) IN NACL 1000-0.9 UT/500ML-% IV SOLN
INTRAVENOUS | Status: AC
  Filled 2018-04-24: qty 1000

## 2018-04-24 MED ORDER — HEPARIN SODIUM (PORCINE) 1000 UNIT/ML IJ SOLN
INTRAMUSCULAR | Status: AC
  Filled 2018-04-24: qty 1

## 2018-04-24 MED ORDER — LIDOCAINE HCL (PF) 1 % IJ SOLN
INTRAMUSCULAR | Status: AC
  Filled 2018-04-24: qty 30

## 2018-04-24 MED ORDER — MAGNESIUM HYDROXIDE 400 MG/5ML PO SUSP: 15.0000 mL | Freq: Every day | ORAL | Status: DC | PRN

## 2018-04-24 MED ORDER — ALUM & MAG HYDROXIDE-SIMETH 200-200-20 MG/5ML PO SUSP
30.0000 mL | ORAL | Status: DC | PRN
  Administered 2018-04-24 (×3): 30 mL via ORAL
  Filled 2018-04-24 (×3): qty 30

## 2018-04-24 SURGICAL SUPPLY — 9 items

## 2018-04-24 NOTE — H&P (View-Only) (Signed)
Progress Note  Patient Name: Marcus Parsons Date of Encounter: 04/24/2018  Primary Cardiologist: No primary care provider on file.   Subjective   No chest pain overnight.   Inpatient Medications    Scheduled Meds: . aspirin EC  81 mg Oral Daily  . atorvastatin  80 mg Oral QHS  . dutasteride  0.5 mg Oral Daily   Continuous Infusions: . heparin 1,200 Units/hr (04/23/18 2216)  . nitroGLYCERIN 60 mcg/min (04/24/18 0500)   PRN Meds: acetaminophen, alum & mag hydroxide-simeth, ondansetron (ZOFRAN) IV   Vital Signs    Vitals:   04/24/18 0005 04/24/18 0041 04/24/18 0054 04/24/18 0513  BP: (!) 154/91 134/77 134/77 (!) 111/54  Pulse: 99   78  Resp: 18   16  Temp: 98.5 F (36.9 C)   98.1 F (36.7 C)  TempSrc: Oral   Oral  SpO2: 98%   95%  Weight: 117.5 kg   117.7 kg  Height: 6' (1.829 m)       Intake/Output Summary (Last 24 hours) at 04/24/2018 0856 Last data filed at 04/24/2018 0514 Gross per 24 hour  Intake 187.22 ml  Output 300 ml  Net -112.78 ml   Last 3 Weights 04/24/2018 04/24/2018 04/23/2018  Weight (lbs) 259 lb 8 oz 259 lb 1.6 oz 255 lb  Weight (kg) 117.708 kg 117.527 kg 115.667 kg      Telemetry    SR - Personally Reviewed  ECG    SR with old inferior infarct- Personally Reviewed  Physical Exam   GEN: No acute distress.   Neck: No JVD Cardiac: RRR, no murmurs, rubs, or gallops.  Respiratory: Clear to auscultation bilaterally. GI: Soft, nontender, non-distended  MS: No edema; No deformity. Neuro:  Nonfocal  Psych: Normal affect   Labs    Chemistry Recent Labs  Lab 04/23/18 2124 04/24/18 0630  NA 138 138  K 4.0 3.7  CL 106 103  CO2 24 28  GLUCOSE 158* 120*  BUN 11 10  CREATININE 1.05 0.96  CALCIUM 9.0 9.4  GFRNONAA >60 >60  GFRAA >60 >60  ANIONGAP 8 7     Hematology Recent Labs  Lab 04/23/18 2124 04/24/18 0630  WBC 8.8 8.5  RBC 4.71 4.49  HGB 14.0 13.0  HCT 43.8 41.0  MCV 93.0 91.3  MCH 29.7 29.0  MCHC 32.0 31.7  RDW 12.7  12.7  PLT 193 211    Cardiac Enzymes Recent Labs  Lab 04/24/18 0048 04/24/18 0630  TROPONINI 0.50* 4.63*    Recent Labs  Lab 04/23/18 2130  TROPIPOC 0.12*     BNP Recent Labs  Lab 04/24/18 0048  BNP 42.5     DDimer No results for input(s): DDIMER in the last 168 hours.   Radiology    Dg Chest 2 View  Result Date: 04/23/2018 CLINICAL DATA:  Chest pain EXAM: PORTABLE CHEST 1 VIEW COMPARISON:  None. FINDINGS: The heart size and mediastinal contours are within normal limits. Both lungs are clear. The visualized skeletal structures are unremarkable. IMPRESSION: No active disease. Electronically Signed   By: Ulyses Jarred M.D.   On: 04/23/2018 22:03    Cardiac Studies   TTE: pending  Patient Profile     73 y.o. male with PMH of STEMI ('15) with PCI to RCA, HL, HTN and OSA who presented with chest pain. Now with NSTEMI. Planned for cardiac cath today.   Assessment & Plan    1. NSTEMI: Troponin at 4.63 this morning. No chest pain while on  nitro gtt. Remains on IV heparin. Planned for cath today. Review of records from cath in 2015 noted residual non-obstructive disease in the LAD, LCx, but 90% in the rPDA that was treated medically. Previously treated with Brilinta for 3 years. Echo pending -- ASA, statin. Consider adding BB  2. HTN: stable without therapy currently. ARB held  3. HL: on high dose statin. LDL 57, Trig 168  For questions or updates, please contact [place] Please consult [email or web address] for contact info under   Signed, Reino Bellis, NP  04/24/2018, 8:56 AM     Agree with note by Reino Bellis NP-C  History of CAD status post stenting in [place] in 2015 with residual disease that was nonobstructive otherwise.  He had chest pain last night.  His enzymes are positive with a troponin of 4.  There are no EKG changes.  He remains pain-free on IV heparin and nitroglycerin.  Exam is benign.  Plan for coronary angiography today.  I have reviewed  the risks, indications, and alternatives to cardiac catheterization, possible angioplasty, and stenting with the patient. Risks include but are not limited to bleeding, infection, vascular injury, stroke, myocardial infection, arrhythmia, kidney injury, radiation-related injury in the case of prolonged fluoroscopy use, emergency cardiac surgery, and death. The patient understands the risks of serious complication is 1-2 in 9417 with diagnostic cardiac cath and 1-2% or less with angioplasty/stenting.    Lorretta Harp, M.D., [place], [facility], [place] [address] [place], [place]  [address]  [phone] 04/24/2018 10:30 AM

## 2018-04-24 NOTE — Progress Notes (Signed)
  Echocardiogram 2D Echocardiogram has been performed.  Marcus Parsons 04/24/2018, 9:23 AM

## 2018-04-24 NOTE — Progress Notes (Signed)
CRITICAL VALUE ALERT  Critical Value:  4.63  Date & Time Notied:  04/24/2018 at 0822  Provider Notified: Mancel Bale, NP  Orders Received/Actions taken: Plans for cath. NP making rounds; will visit with patient shortly

## 2018-04-24 NOTE — Progress Notes (Signed)
ANTICOAGULATION CONSULT NOTE - Initial Consult  Pharmacy Consult for heparin Indication: chest pain/ACS  No Known Allergies  Patient Measurements: Height: 6' (182.9 cm) Weight: 259 lb 8 oz (117.7 kg) IBW/kg (Calculated) : 77.6 Heparin Dosing Weight: 102 kg  Vital Signs: Temp: 98.1 F (36.7 C) (03/11 0513) Temp Source: Oral (03/11 0513) BP: 111/54 (03/11 0513) Pulse Rate: 78 (03/11 0513)  Labs: Recent Labs    04/23/18 2124 04/24/18 0048  HGB 14.0  --   HCT 43.8  --   PLT 193  --   CREATININE 1.05  --   TROPONINI  --  0.50*    Estimated Creatinine Clearance: 86.7 mL/min (by C-G formula based on SCr of 1.05 mg/dL).   Medical History: Past Medical History:  Diagnosis Date  . Acute myocardial infarction ([place]) 04/2013   INFERIOR  . Atherosclerosis of coronary artery   . Benign prostatic hypertrophy   . Carotid stenosis   . Hyperlipidemia   . Obstructive sleep apnea   . S/P coronary artery stent placement    Assessment: 73 yo F presents with CP. Pharmacy consulted to start heparin. CBC stable.  Heparin level therapeutic at 0.44. CBCs stable, no s/sx of bleeding documented.   Goal of Therapy:  Heparin level 0.3-0.7 units/ml Monitor platelets by anticoagulation protocol: Yes   Plan:  Continue heparin gtt at 1,200 units/hr Monitor daily heparin level, CBC, s/s of bleed  Gwenlyn Found, [place] D PGY1 Pharmacy Resident  Phone [phone] 04/24/2018   9:11 AM

## 2018-04-24 NOTE — Progress Notes (Signed)
Dr Marletta Lor made aware CP had improved but now complaining of 8/10 pain. Pt states "it feels like a metal rod is being passed from my shoulder into my lung cavity". EKG obtained without new changes noted from prior. Orders received to continue to titrate Nitro gtt keeping SBP >100. Will continue to monitor. Jessie Foot, RN

## 2018-04-24 NOTE — Progress Notes (Signed)
CRITICAL VALUE ALERT  Critical Value:  8.75  Date & Time Notied:  04/24/2018 at 1229  Provider Notified: Mancel Bale, NP  Patient is prepped and ready for cath lab.

## 2018-04-24 NOTE — Interval H&P Note (Signed)
History and Physical Interval Note:  04/24/2018 4:00 PM  Marcus Parsons  has presented today for surgery, with the diagnosis of n stemi.  The various methods of treatment have been discussed with the patient and family. After consideration of risks, benefits and other options for treatment, the patient has consented to  Procedure(s): LEFT HEART CATH AND CORONARY ANGIOGRAPHY (N/A) as a surgical intervention.  The patient's history has been reviewed, patient examined, no change in status, stable for surgery.  I have reviewed the patient's chart and labs.  Questions were answered to the patient's satisfaction.     Collier Salina [place]

## 2018-04-24 NOTE — Progress Notes (Addendum)
Progress Note  Patient Name: Marcus Parsons Date of Encounter: 04/24/2018  Primary Cardiologist: No primary care provider on file.   Subjective   No chest pain overnight.   Inpatient Medications    Scheduled Meds: . aspirin EC  81 mg Oral Daily  . atorvastatin  80 mg Oral QHS  . dutasteride  0.5 mg Oral Daily   Continuous Infusions: . heparin 1,200 Units/hr (04/23/18 2216)  . nitroGLYCERIN 60 mcg/min (04/24/18 0500)   PRN Meds: acetaminophen, alum & mag hydroxide-simeth, ondansetron (ZOFRAN) IV   Vital Signs    Vitals:   04/24/18 0005 04/24/18 0041 04/24/18 0054 04/24/18 0513  BP: (!) 154/91 134/77 134/77 (!) 111/54  Pulse: 99   78  Resp: 18   16  Temp: 98.5 F (36.9 C)   98.1 F (36.7 C)  TempSrc: Oral   Oral  SpO2: 98%   95%  Weight: 117.5 kg   117.7 kg  Height: 6' (1.829 m)       Intake/Output Summary (Last 24 hours) at 04/24/2018 0856 Last data filed at 04/24/2018 0514 Gross per 24 hour  Intake 187.22 ml  Output 300 ml  Net -112.78 ml   Last 3 Weights 04/24/2018 04/24/2018 04/23/2018  Weight (lbs) 259 lb 8 oz 259 lb 1.6 oz 255 lb  Weight (kg) 117.708 kg 117.527 kg 115.667 kg      Telemetry    SR - Personally Reviewed  ECG    SR with old inferior infarct- Personally Reviewed  Physical Exam   GEN: No acute distress.   Neck: No JVD Cardiac: RRR, no murmurs, rubs, or gallops.  Respiratory: Clear to auscultation bilaterally. GI: Soft, nontender, non-distended  MS: No edema; No deformity. Neuro:  Nonfocal  Psych: Normal affect   Labs    Chemistry Recent Labs  Lab 04/23/18 2124 04/24/18 0630  NA 138 138  K 4.0 3.7  CL 106 103  CO2 24 28  GLUCOSE 158* 120*  BUN 11 10  CREATININE 1.05 0.96  CALCIUM 9.0 9.4  GFRNONAA >60 >60  GFRAA >60 >60  ANIONGAP 8 7     Hematology Recent Labs  Lab 04/23/18 2124 04/24/18 0630  WBC 8.8 8.5  RBC 4.71 4.49  HGB 14.0 13.0  HCT 43.8 41.0  MCV 93.0 91.3  MCH 29.7 29.0  MCHC 32.0 31.7  RDW 12.7  12.7  PLT 193 211    Cardiac Enzymes Recent Labs  Lab 04/24/18 0048 04/24/18 0630  TROPONINI 0.50* 4.63*    Recent Labs  Lab 04/23/18 2130  TROPIPOC 0.12*     BNP Recent Labs  Lab 04/24/18 0048  BNP 42.5     DDimer No results for input(s): DDIMER in the last 168 hours.   Radiology    Dg Chest 2 View  Result Date: 04/23/2018 CLINICAL DATA:  Chest pain EXAM: PORTABLE CHEST 1 VIEW COMPARISON:  None. FINDINGS: The heart size and mediastinal contours are within normal limits. Both lungs are clear. The visualized skeletal structures are unremarkable. IMPRESSION: No active disease. Electronically Signed   By: Ulyses Jarred M.D.   On: 04/23/2018 22:03    Cardiac Studies   TTE: pending  Patient Profile     73 y.o. male with PMH of STEMI ('15) with PCI to RCA, HL, HTN and OSA who presented with chest pain. Now with NSTEMI. Planned for cardiac cath today.   Assessment & Plan    1. NSTEMI: Troponin at 4.63 this morning. No chest pain while on  nitro gtt. Remains on IV heparin. Planned for cath today. Review of records from cath in 2015 noted residual non-obstructive disease in the LAD, LCx, but 90% in the rPDA that was treated medically. Previously treated with Brilinta for 3 years. Echo pending -- ASA, statin. Consider adding BB  2. HTN: stable without therapy currently. ARB held  3. HL: on high dose statin. LDL 57, Trig 168  For questions or updates, please contact [place] Please consult [email or web address] for contact info under   Signed, Reino Bellis, NP  04/24/2018, 8:56 AM     Agree with note by Reino Bellis NP-C  History of CAD status post stenting in [place] in 2015 with residual disease that was nonobstructive otherwise.  He had chest pain last night.  His enzymes are positive with a troponin of 4.  There are no EKG changes.  He remains pain-free on IV heparin and nitroglycerin.  Exam is benign.  Plan for coronary angiography today.  I have reviewed  the risks, indications, and alternatives to cardiac catheterization, possible angioplasty, and stenting with the patient. Risks include but are not limited to bleeding, infection, vascular injury, stroke, myocardial infection, arrhythmia, kidney injury, radiation-related injury in the case of prolonged fluoroscopy use, emergency cardiac surgery, and death. The patient understands the risks of serious complication is 1-2 in 4097 with diagnostic cardiac cath and 1-2% or less with angioplasty/stenting.    Lorretta Harp, M.D., [place], [facility], [place] [address] [place], [place]  [address]  [phone] 04/24/2018 10:30 AM

## 2018-04-24 NOTE — Progress Notes (Signed)
Pt continues to deny CP at this time. VS stable. Will continue to monitor. Jessie Foot, RN

## 2018-04-24 NOTE — Progress Notes (Signed)
Placed patient on CPAP for the night via auto-mode.  

## 2018-04-24 NOTE — Progress Notes (Signed)
Pt resting comfortably at this time. Ntg gtt has been titrated up and now infusing at 31mcg. VS stable (refer to flow sheet). Will continue to monitor. Jessie Foot, RN

## 2018-04-25 ENCOUNTER — Telehealth: Payer: Self-pay | Admitting: Cardiology

## 2018-04-25 ENCOUNTER — Encounter (HOSPITAL_COMMUNITY): Payer: Self-pay | Admitting: Cardiology

## 2018-04-25 DIAGNOSIS — Z8249 Family history of ischemic heart disease and other diseases of the circulatory system: Secondary | ICD-10-CM | POA: Diagnosis not present

## 2018-04-25 DIAGNOSIS — E785 Hyperlipidemia, unspecified: Secondary | ICD-10-CM | POA: Diagnosis present

## 2018-04-25 DIAGNOSIS — Z7982 Long term (current) use of aspirin: Secondary | ICD-10-CM | POA: Diagnosis not present

## 2018-04-25 DIAGNOSIS — I252 Old myocardial infarction: Secondary | ICD-10-CM | POA: Diagnosis not present

## 2018-04-25 DIAGNOSIS — I10 Essential (primary) hypertension: Secondary | ICD-10-CM | POA: Diagnosis present

## 2018-04-25 DIAGNOSIS — Z79899 Other long term (current) drug therapy: Secondary | ICD-10-CM | POA: Diagnosis not present

## 2018-04-25 DIAGNOSIS — N400 Enlarged prostate without lower urinary tract symptoms: Secondary | ICD-10-CM | POA: Diagnosis present

## 2018-04-25 DIAGNOSIS — I2510 Atherosclerotic heart disease of native coronary artery without angina pectoris: Secondary | ICD-10-CM | POA: Diagnosis present

## 2018-04-25 DIAGNOSIS — I214 Non-ST elevation (NSTEMI) myocardial infarction: Secondary | ICD-10-CM | POA: Diagnosis present

## 2018-04-25 DIAGNOSIS — I5021 Acute systolic (congestive) heart failure: Secondary | ICD-10-CM

## 2018-04-25 DIAGNOSIS — G4733 Obstructive sleep apnea (adult) (pediatric): Secondary | ICD-10-CM | POA: Diagnosis present

## 2018-04-25 DIAGNOSIS — Z955 Presence of coronary angioplasty implant and graft: Secondary | ICD-10-CM | POA: Diagnosis not present

## 2018-04-25 LAB — CBC
HCT: 39.5 % (ref 39.0–52.0)
Hemoglobin: 12.8 g/dL — ABNORMAL LOW (ref 13.0–17.0)
MCH: 29.8 pg (ref 26.0–34.0)
MCHC: 32.4 g/dL (ref 30.0–36.0)
MCV: 92.1 fL (ref 80.0–100.0)
PLATELETS: 193 10*3/uL (ref 150–400)
RBC: 4.29 MIL/uL (ref 4.22–5.81)
RDW: 13.1 % (ref 11.5–15.5)
WBC: 8.9 10*3/uL (ref 4.0–10.5)
nRBC: 0 % (ref 0.0–0.2)

## 2018-04-25 MED ORDER — ISOSORBIDE MONONITRATE ER 30 MG PO TB24: 30.0000 mg | ORAL_TABLET | Freq: Every day | ORAL | 1 refills | Status: DC

## 2018-04-25 MED ORDER — CLOPIDOGREL BISULFATE 75 MG PO TABS
75.0000 mg | ORAL_TABLET | Freq: Every day | ORAL | Status: DC
  Administered 2018-04-25: 75 mg via ORAL
  Filled 2018-04-25: qty 1

## 2018-04-25 MED ORDER — METOPROLOL SUCCINATE ER 25 MG PO TB24: 25.0000 mg | ORAL_TABLET | Freq: Every day | ORAL | 1 refills | Status: DC

## 2018-04-25 MED ORDER — CLOPIDOGREL BISULFATE 75 MG PO TABS: 75.0000 mg | ORAL_TABLET | Freq: Every day | ORAL | 1 refills | Status: DC

## 2018-04-25 MED FILL — METOPROLOL SUCCINATE ER 25: 25 | 30 days supply | Qty: 30 | Fill #0 | Status: TO

## 2018-04-25 MED FILL — CLOPIDOGREL 75 MG TABLET: 75 | 90 days supply | Qty: 90 | Fill #0 | Status: TO

## 2018-04-25 MED FILL — ISOSORBIDE MN ER 30 MG TAB: 30 | 30 days supply | Qty: 30 | Fill #0 | Status: TO

## 2018-04-25 NOTE — Progress Notes (Signed)
Upon trying to remove air (3 cc's) from TR band, radial site began to bleed. I injected air back into the band. I tried to remove 3 cc's again an hour later, and it started to bleed. For the remainder of the shift, I removed 1cc about every hour. TR band off at 0620 and no bruising or bleeding is present at the site.

## 2018-04-25 NOTE — Discharge Instructions (Signed)

## 2018-04-25 NOTE — Telephone Encounter (Signed)
° ° °  TOC appt requested by Reino Bellis   appt made with Cecilie Kicks 3/24 @9

## 2018-04-25 NOTE — Care Management Obs Status (Signed)
[place] NOTIFICATION   Patient Details  Name: Juluis Fitzsimmons MRN: [id number] Date of Birth: [birth year]   Medicare Observation Status Notification Given:  Yes    Bethena Roys, RN 04/25/2018, 10:56 AM

## 2018-04-25 NOTE — Progress Notes (Signed)
CARDIAC REHAB PHASE I   PRE:  Rate/Rhythm: 67    BP: sitting 103/77    SaO2:  MODE:  Ambulation: 690 ft   POST:  Rate/Rhythm: 90 SR    BP: sitting 135/77    SaO2:   Tolerated well, no CP. Feels well, quick pace. Ed completed. Good reception. Will refer to [place]. [place], ACSM 04/25/2018 2:11 PM

## 2018-04-25 NOTE — Progress Notes (Signed)
Transitions of Care Follow Up Call Note  Marcus Parsons is an 73 y.o. male who presented to [facility] on 04/23/2018.  The patient had the following prescriptions filled at [facility]: Clopidogrel, isosorbide mononitrate, metoprolol succinate  [x]  Patient's prescriptions filled at the [facility] were transferred to the following pharmacy: [facility]   []  Patient unable to be reached after calling three times and prescriptions filled at the [facility] Trans[facility] Pharmacy were transferred to preferred pharmacy found within their chart.   [name] 04/25/2018, 5:33 PM Transitions of Care Pharmacy Hours: Monday - Friday 8:30am to 5:00 PM  Phone - [phone]

## 2018-04-25 NOTE — Discharge Summary (Addendum)
Discharge Summary    Patient ID: Marcus Parsons,  MRN: [id number], DOB/AGE: 1945-06-07 73 y.o.  Admit date: 04/23/2018 Discharge date: 04/25/2018  Primary Care Provider: [name], [place] Primary Cardiologist: Dr. Johnsie Cancel  Discharge Diagnoses    Active Problems:   NSTEMI (non-ST elevated myocardial infarction) [facility])   Hypertension   Hyperlipidemia   Acute systolic heart failure (HCC)   Allergies No Known Allergies  Diagnostic Studies/Procedures    TTE: 04/24/2018  IMPRESSIONS    1. Mild hypokinesis of the left ventricular, mid-apical inferoseptal wall, inferior wall, inferolateral wall and lateral wall.  2. The left ventricle has mildly reduced systolic function, with an ejection fraction of 45-50%. The cavity size was normal. There is moderate concentric left ventricular hypertrophy. Left ventricular diastolic Doppler parameters are consistent with  impaired relaxation.  3. The right ventricle has normal systolic function. The cavity was normal. There is no increase in right ventricular wall thickness.  4. Left atrial size was moderately dilated.  5. Mild thickening of the mitral valve leaflet. Mild calcification of the mitral valve leaflet.  6. The aortic valve is tricuspid Mild thickening of the aortic valve Mild calcification of the aortic valve. Aortic valve regurgitation is trivial by color flow Doppler.  Cath: 04/24/2018   Previously placed Mid RCA stent (unknown type) is widely patent.  Ost 1st Diag to 1st Diag lesion is 100% stenosed.  LV end diastolic pressure is normal.   1. Single vessel occlusive CAD involving a small first diagonal. The stent in the mid RCA is widely patent. Diffuse coronary ectasia 2. Normal LVEDP  Plan: medical management.  _____________   History of Present Illness     Marcus Parsons is a 73 year old man with known CAD s/p STEMI in 2015 (DES to [facility]), as well as HTN, OSA who presented after an episode of CP. Per report, patient  playing soccer with 65 year old grandson. Post-exercise he developed L sided CP that radiated into his L arm. He trialed SLN x 1 at home without relief and EMS was activated. CP was associated with mild SOB but no diaphoresis, dizziness, or nausea. These symptoms were distinctly different than his prior MI. At the time of assessment discomfort had improved from 8-9/10 - 5/10 on nitro gtt and heparin.   No recent fevers, chills or other infectious symptoms. Denied recent changes in exercise tolerance. No LE swelling, orthopnea/PND.   After increasing nitro gtt pain improved to 3-4/10. Given symptoms he was admitted and planned for cardiac cath.    Hospital Course     Underwent cardiac cath noted above with patent RCA stent, but new 100% stenosed 1st diag lesion. Unable to intervene with plans for medical therapy. Placed on DAPT with ASA.plavix for one year. Follow up Echo showed EF of 45% with mild hypokinesis of the left ventricular, mid-apical inferoseptal wall, inferior wall, inferolateral wall and lateral wall. Toprol and Imdur added post cath. No recurrent chest pain. Seen by cardiac rehab. Instructed to restart home ARB.   General: Well developed, well nourished, male appearing in no acute distress. Head: Normocephalic, atraumatic.  Neck: Supple without bruits, JVD. Lungs:  Resp regular and unlabored, CTA. Heart: RRR, S1, S2, no S3, S4, or murmur; no rub. Abdomen: Soft, non-tender, non-distended with normoactive bowel sounds. No hepatomegaly. No rebound/guarding. No obvious abdominal masses. Extremities: No clubbing, cyanosis, edema. Distal pedal pulses are 2+ bilaterally. Right radial cath site stable without bruising or hematoma Neuro: Alert and oriented X 3.  Moves all extremities spontaneously. Psych: Normal affect.  [place] Calia was seen by Dr. Gwenlyn Found and determined stable for discharge home. Follow up in the office has been arranged. Medications are listed below.   _____________   Discharge Vitals Blood pressure 128/79, pulse 73, temperature 98.8 F (37.1 C), temperature source Oral, resp. rate 16, height 6' (1.829 m), weight 117.7 kg, SpO2 95 %.  Filed Weights   04/23/18 2122 04/24/18 0005 04/24/18 0513  Weight: 115.7 kg 117.5 kg 117.7 kg    Labs & Radiologic Studies    CBC Recent Labs    04/24/18 0630 04/25/18 0502  WBC 8.5 8.9  HGB 13.0 12.8*  HCT 41.0 39.5  MCV 91.3 92.1  PLT 211 831   Basic Metabolic Panel Recent Labs    04/23/18 2124 04/24/18 0630  NA 138 138  K 4.0 3.7  CL 106 103  CO2 24 28  GLUCOSE 158* 120*  BUN 11 10  CREATININE 1.05 0.96  CALCIUM 9.0 9.4   Liver Function Tests No results for input(s): AST, ALT, ALKPHOS, BILITOT, PROT, ALBUMIN in the last 72 hours. No results for input(s): LIPASE, AMYLASE in the last 72 hours. Cardiac Enzymes Recent Labs    04/24/18 0048 04/24/18 0630 04/24/18 1058  TROPONINI 0.50* 4.63* 8.75*   BNP Invalid input(s): POCBNP D-Dimer No results for input(s): DDIMER in the last 72 hours. Hemoglobin A1C Recent Labs    04/24/18 0048  HGBA1C 6.0*   Fasting Lipid Panel Recent Labs    04/24/18 0630  CHOL 125  HDL 34*  LDLCALC 57  TRIG 168*  CHOLHDL 3.7   Thyroid Function Tests No results for input(s): TSH, T4TOTAL, T3FREE, THYROIDAB in the last 72 hours.  Invalid input(s): FREET3 _____________  Dg Chest 2 View  Result Date: 04/23/2018 CLINICAL DATA:  Chest pain EXAM: PORTABLE CHEST 1 VIEW COMPARISON:  None. FINDINGS: The heart size and mediastinal contours are within normal limits. Both lungs are clear. The visualized skeletal structures are unremarkable. IMPRESSION: No active disease. Electronically Signed   By: Ulyses Jarred M.D.   On: 04/23/2018 22:03   Disposition   Pt is being discharged home today in good condition.  Follow-up Plans & Appointments    Follow-up Information    [name], NP Follow up on 05/07/2018.   Specialties:  Cardiology, Radiology Why:  at  [facility] for your follow up appt.  Contact information: [place] [address] [place] [address] [phone]          Discharge Instructions    Call MD for:  redness, tenderness, or signs of infection (pain, swelling, redness, odor or green/yellow discharge around incision site)   Complete by:  As directed    Diet - low sodium heart healthy   Complete by:  As directed    Discharge instructions   Complete by:  As directed    Radial Site Care Refer to this sheet in the next few weeks. These instructions provide you with information on caring for yourself after your procedure. Your caregiver may also give you more specific instructions. Your treatment has been planned according to current medical practices, but problems sometimes occur. Call your caregiver if you have any problems or questions after your procedure. HOME CARE INSTRUCTIONS You may shower the day after the procedure.Remove the bandage (dressing) and gently wash the site with plain soap and water.Gently pat the site dry.  Do not apply powder or lotion to the site.  Do not submerge the  affected site in water for 3 to 5 days.  Inspect the site at least twice daily.  Do not flex or bend the affected arm for 24 hours.  No lifting over 5 pounds (2.3 kg) for 5 days after your procedure.  Do not drive home if you are discharged the same day of the procedure. Have someone else drive you.  You may drive 24 hours after the procedure unless otherwise instructed by your caregiver.  What to expect: Any bruising will usually fade within 1 to 2 weeks.  Blood that collects in the tissue (hematoma) may be painful to the touch. It should usually decrease in size and tenderness within 1 to 2 weeks.  SEEK IMMEDIATE MEDICAL CARE IF: You have unusual pain at the radial site.  You have redness, warmth, swelling, or pain at the radial site.  You have drainage (other than a small amount of blood on the dressing).  You have chills.  You have  a fever or persistent symptoms for more than 72 hours.  You have a fever and your symptoms suddenly get worse.  Your arm becomes pale, cool, tingly, or numb.  You have heavy bleeding from the site. Hold pressure on the site.   Increase activity slowly   Complete by:  As directed        Discharge Medications     Medication List    TAKE these medications   aspirin EC 81 MG tablet Take 81 mg by mouth daily.   atorvastatin 80 MG tablet Commonly known as:  LIPITOR TAKE 1 TABLET AT BEDTIME   clopidogrel 75 MG tablet Commonly known as:  PLAVIX Take 1 tablet (75 mg total) by mouth daily. Start taking on:  April 26, 2018   dutasteride 0.5 MG capsule Commonly known as:  AVODART TAKE 1 CAPSULE EVERY DAY   isosorbide mononitrate 30 MG 24 hr tablet Commonly known as:  IMDUR Take 1 tablet (30 mg total) by mouth daily. Start taking on:  April 26, 2018   metoprolol succinate 25 MG 24 hr tablet Commonly known as:  TOPROL-XL Take 1 tablet (25 mg total) by mouth daily. Start taking on:  April 26, 2018   nitroGLYCERIN 0.4 MG SL tablet Commonly known as:  NITROSTAT Place 1 tablet (0.4 mg total) under the tongue every 5 (five) minutes as needed for chest pain.   olmesartan 40 MG tablet Commonly known as:  BENICAR TAKE 1/2 TABLET EVERY DAY   PRESCRIPTION MEDICATION CPAP: At bedtime   Digestive Advantage Caps Take 1 capsule by mouth daily after supper.   Probiotic Daily Caps Take 1 capsule by mouth daily.       Acute coronary syndrome (MI, NSTEMI, STEMI, etc) this admission?: Yes.     AHA/ACC Clinical Performance & Quality Measures: 1. Aspirin prescribed? - Yes 2. ADP Receptor Inhibitor (Plavix/Clopidogrel, Brilinta/Ticagrelor or Effient/Prasugrel) prescribed (includes medically managed patients)? - Yes 3. Beta Blocker prescribed? - Yes 4. High Intensity Statin (Lipitor 40-80mg  or Crestor 20-40mg ) prescribed? - Yes 5. EF assessed during THIS hospitalization? - Yes 6. For  EF <40%, was ACEI/ARB prescribed? - Not Applicable (EF >/= 88%) 7. For EF <40%, Aldosterone Antagonist (Spironolactone or Eplerenone) prescribed? - Not Applicable (EF >/= 91%) 8. Cardiac Rehab Phase II ordered (Included Medically managed Patients)? - Yes  Outstanding Labs/Studies   N/a   Duration of Discharge Encounter   Greater than 30 minutes including physician time.  Signed, Reino Bellis NP-C 04/25/2018, 12:09 PM  Agree with note by  Reino Bellis NP-C  Stable for DC after NSTEMI with cath showing an occluded small diag. Med Rx recommended. RCA stent patent. OP F/U arranged.  Lorretta Harp, M.D., [place], [facility], [place] [address] [place], [place]  [address]  [phone] 04/25/2018 12:15 PM

## 2018-04-26 ENCOUNTER — Telehealth (HOSPITAL_COMMUNITY): Payer: Self-pay

## 2018-04-26 NOTE — Telephone Encounter (Signed)
Patient contacted regarding discharge from [facility]  on 04/25/18.  Patient understands to follow up with provider laura Dorene Ar NP on 05/07/18 at [facility] and Dr. Johnsie Cancel  06/03/18 at 10am on [employer]. Patient understands discharge instructions? yes Patient understands medications and regiment? yes Patient understands to bring all medications to this visit? Yes  Pt wife reports that his cath site is looking good and will call if he has any further questions or problems.

## 2018-04-26 NOTE — Telephone Encounter (Signed)
Pt insurance is active and benefits verified through Medicare a/b Co-pay 0, DED $198/$198 met, out of pocket 0/0 met, co-insurance 20%. no pre-authorization required. Passport, 04/26/2018 @ 9:39am, REF# [id number] 2ndary insurance is active and benefits verified through [employer]. Co-pay 0, DED 0/0 met, out of pocket 0/0 met, co-insurance 0. No pre-authorization required.   Will contact patient to see if he is interested in the Cardiac Rehab Program. If interested, patient will need to complete follow up appt. Once completed, patient will be contacted for scheduling upon review by the RN Navigator. Tedra Senegal. Support Rep II

## 2018-04-26 NOTE — Telephone Encounter (Signed)
Attempted to call patient in regards to Cardiac Rehab - LM on VM °Gloria W. Support Rep II °

## 2018-04-29 ENCOUNTER — Telehealth: Payer: Self-pay | Admitting: Cardiovascular Disease

## 2018-04-29 MED ORDER — PRASUGREL HCL 10 MG PO TABS: 10.0000 mg | ORAL_TABLET | Freq: Every day | ORAL | 11 refills | Status: DC

## 2018-04-29 NOTE — Telephone Encounter (Signed)
Patient complaining of rash that started yesterday on his abd and rounds to his back. Patient stated it looks like a sunburn. Patient is wanting to know if it has to do with his new medications he started last week after his heart cath. (Plavix, metoprolol, and imdur) Asked patient if he has called his PCP. Patient stated no, that we are the ones who prescribed the medications.  Patient stated he would send a picture through Mychart of his rash. Will forward to Dr. Johnsie Cancel for advisement.

## 2018-04-29 NOTE — Progress Notes (Signed)
Transitions of Care Follow Up Call Note  Marcus Parsons is an 73 y.o. male who presented to [facility] on 04/23/2018.  The patient had the following prescriptions filled at [facility]: isosorbide mononitrate, metoprolol succinate, clopidogrel  Patient was called by pharmacist and HIPAA identifiers were verified. The following questions were asked about the prescriptions filled at [facility]:  Has the patient been experiencing any side effects to the medications prescribed? Yes - see comments below Understanding of regimen: good Understanding of indications: good Potential of compliance: good  Pharmacist comments: patient reports rash that started over the weekend - called cardiologist and MD switched clopidogrel to prasugrel. Counseled patient on prasugrel and instructed to contact MD again if rash is not improved over the next few days. Allergy updated in EPIC per cardiology  [x]  Patient's prescriptions filled at the [facility] Transitions [facility] Care Pharmacy were transferred to the following pharmacy: [place] 04/29/2018, 5:49 PM Transitions of Care Pharmacy Hours: Monday - Friday 8:30am to 5:00 PM  Phone - [phone]

## 2018-04-29 NOTE — Telephone Encounter (Signed)
New Message           Patient's wife is calling to get some advise on husband situation, pls call and advise.

## 2018-04-29 NOTE — Telephone Encounter (Signed)
Can stop plavix and order effient 10 mg daily

## 2018-04-29 NOTE — Telephone Encounter (Signed)
Called patient with Dr. Kyla Balzarine recommendations. Patient will stop plavix and start effient 10 mg daily. Patient verbalized understanding.

## 2018-05-01 ENCOUNTER — Telehealth: Payer: Self-pay

## 2018-05-01 NOTE — Telephone Encounter (Addendum)
Left message for patient to call back to discuss appointment on 3/24 with Cecilie Kicks, NP.

## 2018-05-06 NOTE — Progress Notes (Signed)
Cardiology Office Note   Date:  05/07/2018   ID:  Marcus Parsons, Marcus Parsons 1946-01-08, MRN [id number]  PCP:  Lujean Amel, MD  Cardiologist:  Dr. Johnsie Cancel    Chief Complaint  Patient presents with  . Hospitalization Follow-up      History of Present Illness: Marcus Parsons is a 73 y.o. male who presents for post hospitalization for NSTEMI.   Pt with a hx of CAD s/p STEMI in 2015 (DES to [facility]), as well as HTN, OSA who presented after an episode of CP. Per report, patient playing soccer with 67 year old grandson. Post-exercise he developed L sided CP that radiated into his L arm. He trialed SLNTG x 1 at home without relief and EMS was activated.CP was associated with mild SOB but no diaphoresis, dizziness, or nausea  IV NTG with relief.  He underwent cardiac cath and patent stent in RCA but new 100% stenosed 1st diag.  Unable to intervene -medical therapy recommended.  Echo with EF 45%.  pk troponin 8.75  Today he is doing well, no chest pain and no SOB.  His BP is lower no dizziness.   He is trying to eat healthy but difficult with decreased amounts of fresh food.  Cardiac rehab has been cancelled and I have sent message to rehab asking for educational material or any info on virtual .    Pt developed rash after discharge and plavix stopped and effient added, now imrpoving.     Past Medical History:  Diagnosis Date  . Acute myocardial infarction ([place]) 04/2013   INFERIOR  . Atherosclerosis of coronary artery   . Benign prostatic hypertrophy   . Carotid stenosis   . Hyperlipidemia   . Obstructive sleep apnea   . S/P coronary artery stent placement     Past Surgical History:  Procedure Laterality Date  . HERNIA REPAIR    . KNEE SURGERY     AS CHILD  . LEFT HEART CATH AND CORONARY ANGIOGRAPHY N/A 04/24/2018   Procedure: LEFT HEART CATH AND CORONARY ANGIOGRAPHY;  Surgeon: Martinique, Peter M, MD;  Location: [place] CV LAB;  Service: Cardiovascular;  Laterality: N/A;     Current  Outpatient Medications  Medication Sig Dispense Refill  . aspirin EC 81 MG tablet Take 81 mg by mouth daily.    Marland Kitchen atorvastatin (LIPITOR) 80 MG tablet TAKE 1 TABLET AT BEDTIME 90 tablet 2  . dutasteride (AVODART) 0.5 MG capsule TAKE 1 CAPSULE EVERY DAY 90 capsule 2  . metoprolol succinate (TOPROL-XL) 25 MG 24 hr tablet Take 1 tablet (25 mg total) by mouth daily. 30 tablet 1  . nitroGLYCERIN (NITROSTAT) 0.4 MG SL tablet Place 1 tablet (0.4 mg total) under the tongue every 5 (five) minutes as needed for chest pain. 25 tablet 2  . olmesartan (BENICAR) 40 MG tablet TAKE 1/2 TABLET EVERY DAY 45 tablet 2  . prasugrel (EFFIENT) 10 MG TABS tablet Take 1 tablet (10 mg total) by mouth daily. 30 tablet 11  . PRESCRIPTION MEDICATION CPAP: At bedtime    . Probiotic Product (DIGESTIVE ADVANTAGE) CAPS Take 1 capsule by mouth daily after supper.    . Probiotic Product (PROBIOTIC DAILY) CAPS Take 1 capsule by mouth daily. 90 capsule 3  . tamsulosin (FLOMAX) 0.4 MG CAPS capsule Take 0.4 mg by mouth daily.     No current facility-administered medications for this visit.     Allergies:   Plavix [clopidogrel bisulfate]    Social History:  The patient  reports  that he has never smoked. He has never used smokeless tobacco. He reports that he does not drink alcohol or use drugs.   Family History:  The patient's family history includes CAD in his paternal uncle; Diabetes Mellitus I in his father; Heart disease in his paternal uncle; Hypertension in his father; Leukemia in his maternal uncle; Lung cancer in his father.    ROS:  General:no colds or fevers, no weight changes Skin:no rashes or ulcers HEENT:no blurred vision, no congestion CV:see HPI PUL:see HPI GI:no diarrhea constipation or melena, no indigestion GU:no hematuria, no dysuria MS:no joint pain, no claudication Neuro:no syncope, no lightheadedness Endo:no diabetes, no thyroid disease  Wt Readings from Last 3 Encounters:  05/07/18 256 lb 12.8 oz  (116.5 kg)  04/24/18 259 lb 8 oz (117.7 kg)  05/31/17 245 lb 8 oz (111.4 kg)     PHYSICAL EXAM: VS:  BP (!) 90/58   Pulse 79   Ht 6' (1.829 m)   Wt 256 lb 12.8 oz (116.5 kg)   SpO2 92%   BMI 34.83 kg/m  , BMI Body mass index is 34.83 kg/m. General:Pleasant affect, NAD Skin:Warm and dry, brisk capillary refill, rash improving  HEENT:normocephalic, sclera clear, mucus membranes moist Neck:supple, no JVD, no bruits  Heart:S1S2 RRR without murmur, gallup, rub or click Lungs:clear without rales, rhonchi, or wheezes WVP:XTGG, non tender, + BS, do not palpate liver spleen or masses Ext:no lower ext edema, 2+ pedal pulses, 2+ radial pulses Neuro:alert and oriented X 3, MAE, follows commands, + facial symmetry    EKG:  EKG is NOT ordered today.    Recent Labs: 04/24/2018: B Natriuretic Peptide 42.5; BUN 10; Creatinine, Ser 0.96; Potassium 3.7; Sodium 138 04/25/2018: Hemoglobin 12.8; Platelets 193    Lipid Panel    Component Value Date/Time   CHOL 125 04/24/2018 0630   TRIG 168 (H) 04/24/2018 0630   HDL 34 (L) 04/24/2018 0630   CHOLHDL 3.7 04/24/2018 0630   VLDL 34 04/24/2018 0630   LDLCALC 57 04/24/2018 0630       Other studies Reviewed: Additional studies/ records that were reviewed today include:. cardiac cath 04/24/18  Previously placed Mid RCA stent (unknown type) is widely patent.  Ost 1st Diag to 1st Diag lesion is 100% stenosed.  LV end diastolic pressure is normal.   1. Single vessel occlusive CAD involving a small first diagonal. The stent in the mid RCA is widely patent. Diffuse coronary ectasia 2. Normal LVEDP  Plan: medical management.   Echo 04/24/18 IMPRESSIONS   1. Mild hypokinesis of the left ventricular, mid-apical inferoseptal wall, inferior wall, inferolateral wall and lateral wall.  2. The left ventricle has mildly reduced systolic function, with an ejection fraction of 45-50%. The cavity size was normal. There is moderate concentric left  ventricular hypertrophy. Left ventricular diastolic Doppler parameters are consistent with  impaired relaxation.  3. The right ventricle has normal systolic function. The cavity was normal. There is no increase in right ventricular wall thickness.  4. Left atrial size was moderately dilated.  5. Mild thickening of the mitral valve leaflet. Mild calcification of the mitral valve leaflet.  6. The aortic valve is tricuspid Mild thickening of the aortic valve Mild calcification of the aortic valve. Aortic valve regurgitation is trivial by color flow Doppler.  FINDINGS  Left Ventricle: The left ventricle has mildly reduced systolic function, with an ejection fraction of 45-50%. The cavity size was normal. There is moderate concentric left ventricular hypertrophy. Left ventricular diastolic Doppler  parameters are  consistent with impaired relaxation. Mild hypokinesis of the left ventricular, mid-apical inferoseptal wall, inferior wall, inferolateral wall and lateral wall. Right Ventricle: The right ventricle has normal systolic function. The cavity was normal. There is no increase in right ventricular wall thickness. Left Atrium: left atrial size was moderately dilated Right Atrium: right atrial size was normal in size. Right atrial pressure is estimated at 3 mmHg. Interatrial Septum: No atrial level shunt detected by color flow Doppler. Pericardium: There is no evidence of pericardial effusion. Mitral Valve: The mitral valve is normal in structure. Mild thickening of the mitral valve leaflet. Mild calcification of the mitral valve leaflet. Mitral valve regurgitation is not visualized by color flow Doppler. Tricuspid Valve: The tricuspid valve is normal in structure. Tricuspid valve regurgitation is trivial by color flow Doppler. Aortic Valve: The aortic valve is tricuspid Mild thickening of the aortic valve Mild calcification of the aortic valve. Aortic valve regurgitation is trivial by color flow  Doppler. Pulmonic Valve: The pulmonic valve was grossly normal. Pulmonic valve regurgitation is trivial by color flow Doppler. Venous: The inferior vena cava is normal in size with greater than 50% respiratory variability.   LEFT VENTRICLE PLAX 2D LVIDd:         4.70 cm  Diastology LVIDs:         3.20 cm  LV e' lateral:   8.38 cm/s LV PW:         1.60 cm  LV E/e' lateral: 7.0 LV IVS:        1.70 cm  LV e' medial:    4.35 cm/s LVOT diam:     2.30 cm  LV E/e' medial:  13.4 LV SV:         61 ml LV SV Index:   24.70 LVOT Area:     4.15 cm  RIGHT VENTRICLE RVSP:           26.0 mmHg  LEFT ATRIUM           Index       RIGHT ATRIUM           Index LA diam:      5.10 cm 2.14 cm/m  RA Pressure: 3 mmHg LA Vol (A2C): 56.3 ml 23.66 ml/m RA Area:     17.30 cm LA Vol (A4C): 99.2 ml 41.68 ml/m RA Volume:   43.30 ml  18.19 ml/m  AORTIC VALVE LVOT Vmax:   116.00 cm/s LVOT Vmean:  86.800 cm/s LVOT VTI:    0.267 m  ASSESSMENT AND PLAN:  1.  CAD with NSTEMI new meds added of Imdur and metoprolol  BP low today and has been low at home.  No angina.  Will decrease imdur to 15 mg daily.  He will call if BP is lower than 100/60 I would then decrease benicar to 10 mg down from current 20 mg.   He will follow up the end of May with Dr. Johnsie Cancel - instructed on WebX for appts or just to call for issues. Continue metoprolol. Continue asa and effient.  Treating medically.    2.  HTN currently hypotensive.  decreased imdur.  See above.   3.  HLD on lipitor 80 mg. LDL 57  4.  Cardiomyopathy with EF 45-50%  On ARB.     Current medicines are reviewed with the patient today.  The patient Has no concerns regarding medicines.  The following changes have been made:  See above Labs/ tests ordered today include:see above  Disposition:   FU:  see above  Signed, Cecilie Kicks, NP  05/07/2018 9:27 AM    [facility] [place], [place], [place], [place] Phone: [phone]; Fax: [phone]

## 2018-05-07 ENCOUNTER — Ambulatory Visit (INDEPENDENT_AMBULATORY_CARE_PROVIDER_SITE_OTHER): Payer: Medicare Other | Admitting: Cardiology

## 2018-05-07 ENCOUNTER — Other Ambulatory Visit: Payer: Self-pay

## 2018-05-07 ENCOUNTER — Encounter: Payer: Self-pay | Admitting: Cardiology

## 2018-05-07 VITALS — BP 90/58 | HR 79 | Ht 72.0 in | Wt 256.8 lb

## 2018-05-07 DIAGNOSIS — I10 Essential (primary) hypertension: Secondary | ICD-10-CM

## 2018-05-07 DIAGNOSIS — I214 Non-ST elevation (NSTEMI) myocardial infarction: Secondary | ICD-10-CM

## 2018-05-07 DIAGNOSIS — E782 Mixed hyperlipidemia: Secondary | ICD-10-CM

## 2018-05-07 DIAGNOSIS — I2510 Atherosclerotic heart disease of native coronary artery without angina pectoris: Secondary | ICD-10-CM | POA: Diagnosis not present

## 2018-05-07 MED ORDER — OLMESARTAN MEDOXOMIL 40 MG PO TABS: 20.0000 mg | ORAL_TABLET | Freq: Every day | ORAL | 2 refills | Status: DC

## 2018-05-07 MED ORDER — ATORVASTATIN CALCIUM 80 MG PO TABS: 80.0000 mg | ORAL_TABLET | Freq: Every day | ORAL | 3 refills | Status: DC

## 2018-05-07 MED ORDER — METOPROLOL SUCCINATE ER 25 MG PO TB24: 25.0000 mg | ORAL_TABLET | Freq: Every day | ORAL | 11 refills | Status: DC

## 2018-05-07 MED ORDER — NITROGLYCERIN 0.4 MG SL SUBL: 0.4000 mg | SUBLINGUAL_TABLET | SUBLINGUAL | 5 refills | Status: DC | PRN

## 2018-05-07 MED ORDER — ISOSORBIDE MONONITRATE ER 30 MG PO TB24: 15.0000 mg | ORAL_TABLET | Freq: Every day | ORAL | 3 refills | Status: DC

## 2018-05-07 MED ORDER — PRASUGREL HCL 10 MG PO TABS: 10.0000 mg | ORAL_TABLET | Freq: Every day | ORAL | 11 refills | Status: DC

## 2018-05-07 NOTE — Patient Instructions (Signed)
Medication Instructions:  Your physician has recommended you make the following change in your medication:  1. DECREASE ISOSORBIDE TO 15 MG DAILY.  2. Refills have been sent in to your pharmacy for your Cardiac medications.  If you need a refill on your cardiac medications before your next appointment, please call your pharmacy.   Lab work: NONE  If you have labs (blood work) drawn today and your tests are completely normal, you will receive your results only by: Marland Kitchen MyChart Message (if you have MyChart) OR . A paper copy in the mail If you have any lab test that is abnormal or we need to change your treatment, we will call you to review the results.  Testing/Procedures: NONE  Follow-Up: At [facility], you and your health needs are our priority.  As part of our continuing mission to provide you with exceptional heart care, we have created designated Provider Care Teams.  These Care Teams include your primary Cardiologist (physician) and Advanced Practice Providers (APPs -  Physician Assistants and Nurse Practitioners) who all work together to provide you with the care you need, when you need it. You will need a follow up appointment in [place], NP Cecilie Kicks, NP . Kathyrn Drown , NP  YOU HAVE BEEN REFERRED TO CARDIAC REHAB AND THEY WILL CALL YOU TO SCHEDULE AN APPOINTMENT.   Any Other Special Instructions Will Be Listed Below (If Applicable).  Please check your blood pressure 1-2 times per day for 2 weeks and send readings and if your blood pressure runs below 100/60 please call our office. [phone]   Heart-Healthy Eating Plan Heart-healthy meal planning includes:  Eating less unhealthy fats.  Eating more healthy fats.  Making other changes in your diet. Talk with your doctor or a diet specialist (dietitian) to create an eating plan that is right for you.  What are tips for following this plan? Cooking Avoid frying your food. Try  to bake, boil, grill, or broil it instead. You can also reduce fat by:  Removing the skin from poultry.  Removing all visible fats from meats.  Steaming vegetables in water or broth. Meal planning   At meals, divide your plate into four equal parts: ? Fill one-half of your plate with vegetables and green salads. ? Fill one-fourth of your plate with whole grains. ? Fill one-fourth of your plate with lean protein foods.  Eat 4-5 servings of vegetables per day. A serving of vegetables is: ? 1 cup of raw or cooked vegetables. ? 2 cups of raw leafy greens.  Eat 4-5 servings of fruit per day. A serving of fruit is: ? 1 medium whole fruit. ?  cup of dried fruit. ?  cup of fresh, frozen, or canned fruit. ?  cup of 100% fruit juice.  Eat more foods that have soluble fiber. These are apples, broccoli, carrots, beans, peas, and barley. Try to get 20-30 g of fiber per day.  Eat 4-5 servings of nuts, legumes, and seeds per week: ? 1 serving of dried beans or legumes equals  cup after being cooked. ? 1 serving of nuts is  cup. ? 1 serving of seeds equals 1 tablespoon. General information  Eat more home-cooked food. Eat less restaurant, buffet, and fast food.  Limit or avoid alcohol.  Limit foods that are high in starch and sugar.  Avoid fried foods.  Lose weight if you are overweight.  Keep track of how much salt (sodium) you  eat. This is important if you have high blood pressure. Ask your doctor to tell you more about this.  Try to add vegetarian meals each week. Fats  Choose healthy fats. These include olive oil and canola oil, flaxseeds, walnuts, almonds, and seeds.  Eat more omega-3 fats. These include salmon, mackerel, sardines, tuna, flaxseed oil, and ground flaxseeds. Try to eat fish at least 2 times each week.  Check food labels. Avoid foods with trans fats or high amounts of saturated fat.  Limit saturated fats. ? These are often found in animal products, such  as meats, butter, and cream. ? These are also found in plant foods, such as palm oil, palm kernel oil, and coconut oil.  Avoid foods with partially hydrogenated oils in them. These have trans fats. Examples are stick margarine, some tub margarines, cookies, crackers, and other baked goods. What foods can I eat? Fruits All fresh, canned (in natural juice), or frozen fruits. Vegetables Fresh or frozen vegetables (raw, steamed, roasted, or grilled). Green salads. Grains Most grains. Choose whole wheat and whole grains most of the time. Rice and pasta, including brown rice and pastas made with whole wheat. Meats and other proteins Lean, well-trimmed beef, veal, pork, and lamb. Chicken and [place] without skin. All fish and shellfish. Wild duck, rabbit, pheasant, and venison. Egg whites or low-cholesterol egg substitutes. Dried beans, peas, lentils, and tofu. Seeds and most nuts. Dairy Low-fat or nonfat cheeses, including ricotta and mozzarella. Skim or 1% milk that is liquid, powdered, or evaporated. Buttermilk that is made with low-fat milk. Nonfat or low-fat yogurt. Fats and oils Non-hydrogenated (trans-free) margarines. Vegetable oils, including soybean, sesame, sunflower, olive, peanut, safflower, corn, canola, and cottonseed. Salad dressings or mayonnaise made with a vegetable oil. Beverages Mineral water. Coffee and tea. Diet carbonated beverages. Sweets and desserts Sherbet, gelatin, and fruit ice. Small amounts of dark chocolate. Limit all sweets and desserts. Seasonings and condiments All seasonings and condiments. The items listed above may not be a complete list of foods and drinks you can eat. Contact a dietitian for more options. What foods should I avoid? Fruits Canned fruit in heavy syrup. Fruit in cream or butter sauce. Fried fruit. Limit coconut. Vegetables Vegetables cooked in cheese, cream, or butter sauce. Fried vegetables. Grains Breads that are made with saturated or  trans fats, oils, or whole milk. Croissants. Sweet rolls. Donuts. High-fat crackers, such as cheese crackers. Meats and other proteins Fatty meats, such as hot dogs, ribs, sausage, bacon, rib-eye roast or steak. High-fat deli meats, such as salami and bologna. Caviar. Domestic duck and goose. Organ meats, such as liver. Dairy Cream, sour cream, cream cheese, and creamed cottage cheese. Whole-milk cheeses. Whole or 2% milk that is liquid, evaporated, or condensed. Whole buttermilk. Cream sauce or high-fat cheese sauce. Yogurt that is made from whole milk. Fats and oils Meat fat, or shortening. Cocoa butter, hydrogenated oils, palm oil, coconut oil, palm kernel oil. Solid fats and shortenings, including bacon fat, salt pork, lard, and butter. Nondairy cream substitutes. Salad dressings with cheese or sour cream. Beverages Regular sodas and juice drinks with added sugar. Sweets and desserts Frosting. Pudding. Cookies. Cakes. Pies. Milk chocolate or white chocolate. Buttered syrups. Full-fat ice cream or ice cream drinks. The items listed above may not be a complete list of foods and drinks to avoid. Contact a dietitian for more information. Summary  Heart-healthy meal planning includes eating less unhealthy fats, eating more healthy fats, and making other changes in your diet.  Eat a balanced diet. This includes fruits and vegetables, low-fat or nonfat dairy, lean protein, nuts and legumes, whole grains, and heart-healthy oils and fats. This information is not intended to replace advice given to you by your health care provider. Make sure you discuss any questions you have with your health care provider. Document Released: 08/01/2011 Document Revised: 03/09/2017 Document Reviewed: 03/09/2017 Elsevier Interactive Patient Education  2019 [employer].

## 2018-05-08 ENCOUNTER — Telehealth (HOSPITAL_COMMUNITY): Payer: Self-pay

## 2018-05-08 ENCOUNTER — Other Ambulatory Visit: Payer: Self-pay

## 2018-05-08 MED ORDER — PRASUGREL HCL 10 MG PO TABS: 10.0000 mg | ORAL_TABLET | Freq: Every day | ORAL | 3 refills | Status: DC

## 2018-05-08 MED ORDER — METOPROLOL SUCCINATE ER 25 MG PO TB24: 25.0000 mg | ORAL_TABLET | Freq: Every day | ORAL | 3 refills | Status: DC

## 2018-05-08 MED ORDER — DUTASTERIDE 0.5 MG PO CAPS: 0.5000 mg | ORAL_CAPSULE | Freq: Every day | ORAL | 3 refills | Status: AC

## 2018-05-08 NOTE — Telephone Encounter (Signed)
Received word from NP Cecilie Kicks that patient requested heart heatlhy eating material as Cardiac Rehab is currently closed. Called pt to discuss nutrition education and counseling for heart healthy eating. Left voicemail and contact information. Mailed packet of heart healthy eating to patient.

## 2018-05-08 NOTE — Addendum Note (Signed)
Addended by: De Burrs on: 05/08/2018 03:56 PM   Modules accepted: Orders

## 2018-05-09 NOTE — Telephone Encounter (Signed)
Attempted to call patient in regards to Cardiac Rehab - LM on VM 

## 2018-05-28 ENCOUNTER — Telehealth (HOSPITAL_COMMUNITY): Payer: Self-pay | Admitting: *Deleted

## 2018-05-28 ENCOUNTER — Telehealth: Payer: Self-pay

## 2018-05-28 NOTE — Telephone Encounter (Signed)
Called and left message for cardiac rehab referral and departmental closure due to Covid-19. Requested call back.  Contact information left .  Since we have been unable to contact pt by phone.  Will mail letter.

## 2018-05-28 NOTE — Telephone Encounter (Signed)
I left a message for the patient to return my call about tele-health ([email or web address]) appt with Dr. Johnsie Cancel

## 2018-05-30 NOTE — Progress Notes (Deleted)
Virtual Visit via Video Note   This visit type was conducted due to national recommendations for restrictions regarding the COVID-19 Pandemic (e.g. social distancing) in an effort to limit this patient's exposure and mitigate transmission in our community.  Due to his co-morbid illnesses, this patient is at least at moderate risk for complications without adequate follow up.  This format is felt to be most appropriate for this patient at this time.  All issues noted in this document were discussed and addressed.  A limited physical exam was performed with this format.  Please refer to the patient's chart for his consent to telehealth for [facility].   Evaluation Performed:  Follow-up visit  Date:  05/30/2018   ID:  Marcus Parsons, Marcus Parsons Jun 02, 1945, MRN [id number]  Patient Location: Home Provider Location: Office  PCP:  Lujean Amel, MD  Cardiologist:  Johnsie Cancel Electrophysiologist:  None   Chief Complaint: CAD/MI  History of Present Illness:     Marcus Parsons is a 73 y.o. male who presents for post hospitalization for NSTEMI. D/C on 04/25/18  Pt with a hx of CAD s/p STEMI in 2015 (DES to [facility]), as well as HTN, OSA who presentedafter an episode of CP. Per report, patient playing soccer with 82 year old grandson. Post-exercise he developed L sided CP that radiated into his L arm. He trialed SLNTG x 1 at home without relief and EMS was activated.CP was associated with mild SOB but no diaphoresis, dizziness, or nausea  IV NTG with relief.  He underwent cardiac cath and patent stent in RCA but new 100% stenosed 1st diag.  Unable to intervene -medical therapy recommended.  Echo with EF 45%.  pk troponin 8.75   Pt developed rash after discharge and plavix stopped and effient added, now imrpoving.  No angina Activity limited by COVID restrictions no cardiac rehab  ***  The patient does not have symptoms concerning for COVID-19 infection (fever, chills, cough, or new shortness of breath).    Past Medical History:  Diagnosis Date  . Acute myocardial infarction ([place]) 04/2013   INFERIOR  . Atherosclerosis of coronary artery   . Benign prostatic hypertrophy   . Carotid stenosis   . Hyperlipidemia   . Obstructive sleep apnea   . S/P coronary artery stent placement    Past Surgical History:  Procedure Laterality Date  . HERNIA REPAIR    . KNEE SURGERY     AS CHILD  . LEFT HEART CATH AND CORONARY ANGIOGRAPHY N/A 04/24/2018   Procedure: LEFT HEART CATH AND CORONARY ANGIOGRAPHY;  Surgeon: Martinique, Bashir Marchetti M, MD;  Location: [place] CV LAB;  Service: Cardiovascular;  Laterality: N/A;     No outpatient medications have been marked as taking for the 06/03/18 encounter (Appointment) with Josue Hector, MD.     Allergies:   Plavix [clopidogrel bisulfate]   Social History   Tobacco Use  . Smoking status: Never Smoker  . Smokeless tobacco: Never Used  Substance Use Topics  . Alcohol use: No  . Drug use: No     Family Hx: The patient's family history includes CAD in his paternal uncle; Diabetes Mellitus I in his father; Heart disease in his paternal uncle; Hypertension in his father; Leukemia in his maternal uncle; Lung cancer in his father.  ROS:   Please see the history of present illness.     All other systems reviewed and are negative.   Prior CV studies:   The following studies were reviewed today:  CAth  04/24/18 Echo 04/24/18  Labs/Other Tests and Data Reviewed:    EKG: SR IPWMI no acute ST segment changes   Recent Labs: 04/24/2018: B Natriuretic Peptide 42.5; BUN 10; Creatinine, Ser 0.96; Potassium 3.7; Sodium 138 04/25/2018: Hemoglobin 12.8; Platelets 193   Recent Lipid Panel Lab Results  Component Value Date/Time   CHOL 125 04/24/2018 06:30 AM   TRIG 168 (H) 04/24/2018 06:30 AM   HDL 34 (L) 04/24/2018 06:30 AM   CHOLHDL 3.7 04/24/2018 06:30 AM   LDLCALC 57 04/24/2018 06:30 AM    Wt Readings from Last 3 Encounters:  05/07/18 116.5 kg  04/24/18  117.7 kg  05/31/17 111.4 kg     Objective:    Vital Signs:  There were no vitals taken for this visit.   Well nourished, well developed male in no acute distress. Skin warm and dry Right radial cath sight healed No tachypnea No JVP elevation  No edema  ASSESSMENT & PLAN:    1. CAD:  04/24/18 SEMI occluded D1 not opened. Patent older stent in mid RCA medical Rx Rash with Plavix continue Effient 2. HLD  Continue lipitor labs in 3 months  3. Prostatism:  Continue flomax and avodart PSA with primary   COVID-19 Education: The signs and symptoms of COVID-19 were discussed with the patient and how to seek care for testing (follow up with PCP or arrange E-visit).  The importance of social distancing was discussed today.  Time:   Today, I have spent 30 minutes with the patient with telehealth technology discussing the above problems.     Medication Adjustments/Labs and Tests Ordered: Current medicines are reviewed at length with the patient today.  Concerns regarding medicines are outlined above.   Tests Ordered: No orders of the defined types were placed in this encounter.   Medication Changes: No orders of the defined types were placed in this encounter.   Disposition:  Follow up in a year   Signed, Jenkins Rouge, MD  05/30/2018 1:25 PM    [facility]

## 2018-05-31 NOTE — Telephone Encounter (Signed)
Left second message on both phone numbers listed for patient to call back. Patient has appointment on Monday. Patient will need to have a virtual visit per COVID19 protocol. Will await for patient to call back.

## 2018-06-03 ENCOUNTER — Ambulatory Visit: Payer: Medicare Other | Admitting: Cardiovascular Disease

## 2018-06-04 ENCOUNTER — Encounter (HOSPITAL_COMMUNITY): Payer: Self-pay

## 2018-07-01 ENCOUNTER — Telehealth: Payer: Self-pay | Admitting: Cardiology

## 2018-07-01 ENCOUNTER — Encounter: Payer: Self-pay | Admitting: *Deleted

## 2018-07-01 NOTE — Telephone Encounter (Signed)
New message      Called patient to convert 07-10-18 ov to a video visit.  Patient gave consent for video visit.  Patient will have recent bp reading for nurse.

## 2018-07-09 NOTE — Progress Notes (Signed)
Virtual Visit via Video Note   This visit type was conducted due to national recommendations for restrictions regarding the COVID-19 Pandemic (e.g. social distancing) in an effort to limit this patient's exposure and mitigate transmission in our community.  Due to his co-morbid illnesses, this patient is at least at moderate risk for complications without adequate follow up.  This format is felt to be most appropriate for this patient at this time.  All issues noted in this document were discussed and addressed.  A limited physical exam was performed with this format.  Please refer to the patient's chart for his consent to telehealth for [facility].   Date:  07/10/2018   ID:  Marcus, Parsons [birth year], MRN [id number]  Patient Location: Home Provider Location: Office  PCP:  Lujean Amel, MD  Cardiologist:  Jenkins Rouge, MD  Electrophysiologist:  None   Evaluation Performed:  Follow-Up Visit  Chief Complaint:  CAD  History of Present Illness:    Marcus Parsons is a 73 y.o. male with a hx of CAD s/p STEMI in 2015 (DES to [facility]), as well as HTN, OSA who presentedafter an episode of CP. Per report, patient playing soccer with 23 year old grandson. Post-exercise he developed L sided CP that radiated into his L arm. He trialed SLNTG x 1 at home without relief and EMS was activated.CP was associated with mild SOB but no diaphoresis, dizziness, or nausea  IV NTG with relief.  He underwent cardiac cath and patent stent in RCA but new 100% stenosed 1st diag.  Unable to intervene -medical therapy recommended.  Echo with EF 45%.  pk troponin 8.75  On last visit he was doing well, no chest pain and no SOB.  His BP is lower no dizziness.   He was trying to eat healthy but difficult with decreased amounts of fresh food.  Cardiac rehab has been cancelled and I have sent message to rehab asking for educational material or any info on virtual .    Pt developed rash after discharge and plavix  stopped and effient added, now resolved and no issues with the Effient.  Today he has no chest pain and no SOB.  He is not exercising much and we discussed.  Cardiac rehab is not yet open but would still recommend when available.  Initial HR was up but on recheck at 67 BPM.  He does complain of fatigue.    his BP is on lower end.  No racing HR or awareness of HR. He continues to eat healthy.  He does wear mask if he must go out.    Needs follow up lipids   The patient does not have symptoms concerning for COVID-19 infection (fever, chills, cough, or new shortness of breath).    Past Medical History:  Diagnosis Date  . Acute myocardial infarction ([place]) 04/2013   INFERIOR  . Atherosclerosis of coronary artery   . Benign prostatic hypertrophy   . Carotid stenosis   . Hyperlipidemia   . Obstructive sleep apnea   . S/P coronary artery stent placement    Past Surgical History:  Procedure Laterality Date  . HERNIA REPAIR    . KNEE SURGERY     AS CHILD  . LEFT HEART CATH AND CORONARY ANGIOGRAPHY N/A 04/24/2018   Procedure: LEFT HEART CATH AND CORONARY ANGIOGRAPHY;  Surgeon: Martinique, Peter M, MD;  Location: [facility];  Service: Cardiovascular;  Laterality: N/A;     Current Meds  Medication Sig  .  aspirin EC 81 MG tablet Take 81 mg by mouth daily.  Marland Kitchen atorvastatin (LIPITOR) 80 MG tablet Take 1 tablet (80 mg total) by mouth at bedtime.  . dutasteride (AVODART) 0.5 MG capsule Take 1 capsule (0.5 mg total) by mouth daily.  . isosorbide mononitrate (IMDUR) 30 MG 24 hr tablet Take 0.5 tablets (15 mg total) by mouth daily.  . metoprolol succinate (TOPROL-XL) 25 MG 24 hr tablet Take 1 tablet (25 mg total) by mouth daily.  . nitroGLYCERIN (NITROSTAT) 0.4 MG SL tablet Place 1 tablet (0.4 mg total) under the tongue every 5 (five) minutes as needed for chest pain.  Marland Kitchen olmesartan (BENICAR) 40 MG tablet Take 0.5 tablets (20 mg total) by mouth daily.  . prasugrel (EFFIENT) 10 MG TABS tablet  Take 1 tablet (10 mg total) by mouth daily.  Marland Kitchen PRESCRIPTION MEDICATION CPAP: At bedtime  . Probiotic Product (DIGESTIVE ADVANTAGE) CAPS Take 1 capsule by mouth daily after supper.  . Probiotic Product (PROBIOTIC DAILY) CAPS Take 1 capsule by mouth daily.  . tamsulosin (FLOMAX) 0.4 MG CAPS capsule Take 0.4 mg by mouth daily.     Allergies:   Plavix [clopidogrel bisulfate]   Social History   Tobacco Use  . Smoking status: Never Smoker  . Smokeless tobacco: Never Used  Substance Use Topics  . Alcohol use: No  . Drug use: No     Family Hx: The patient's family history includes CAD in his paternal uncle; Diabetes Mellitus I in his father; Heart disease in his paternal uncle; Hypertension in his father; Leukemia in his maternal uncle; Lung cancer in his father.  ROS:   Please see the history of present illness.    General:no colds or fevers, no weight changes Skin:no further rashes or ulcers HEENT:no blurred vision, no congestion CV:see HPI PUL:see HPI GI:no diarrhea constipation or melena, no indigestion GU:no hematuria, no dysuria MS:no joint pain, no claudication Neuro:no syncope, no lightheadedness Endo:no diabetes, no thyroid disease  All other systems reviewed and are negative.   Prior CV studies:   The following studies were reviewed today:  cardiac cath 04/24/18  Previously placed Mid RCA stent (unknown type) is widely patent.  Ost 1st Diag to 1st Diag lesion is 100% stenosed.  LV end diastolic pressure is normal.  1. Single vessel occlusive CAD involving a small first diagonal. The stent in the mid RCA is widely patent. Diffuse coronary ectasia 2. Normal LVEDP  Plan: medical management.   Echo 04/24/18 IMPRESSIONS  1. Mild hypokinesis of the left ventricular, mid-apical inferoseptal wall, inferior wall, inferolateral wall and lateral wall. 2. The left ventricle has mildly reduced systolic function, with an ejection fraction of 45-50%. The cavity size  was normal. There is moderate concentric left ventricular hypertrophy. Left ventricular diastolic Doppler parameters are consistent with  impaired relaxation. 3. The right ventricle has normal systolic function. The cavity was normal. There is no increase in right ventricular wall thickness. 4. Left atrial size was moderately dilated. 5. Mild thickening of the mitral valve leaflet. Mild calcification of the mitral valve leaflet. 6. The aortic valve is tricuspid Mild thickening of the aortic valve Mild calcification of the aortic valve. Aortic valve regurgitation is trivial by color flow Doppler.  FINDINGS Left Ventricle: The left ventricle has mildly reduced systolic function, with an ejection fraction of 45-50%. The cavity size was normal. There is moderate concentric left ventricular hypertrophy. Left ventricular diastolic Doppler parameters are  consistent with impaired relaxation. Mild hypokinesis of the left ventricular,  mid-apical inferoseptal wall, inferior wall, inferolateral wall and lateral wall. Right Ventricle: The right ventricle has normal systolic function. The cavity was normal. There is no increase in right ventricular wall thickness. Left Atrium: left atrial size was moderately dilated Right Atrium: right atrial size was normal in size. Right atrial pressure is estimated at 3 mmHg. Interatrial Septum: No atrial level shunt detected by color flow Doppler. Pericardium: There is no evidence of pericardial effusion. Mitral Valve: The mitral valve is normal in structure. Mild thickening of the mitral valve leaflet. Mild calcification of the mitral valve leaflet. Mitral valve regurgitation is not visualized by color flow Doppler. Tricuspid Valve: The tricuspid valve is normal in structure. Tricuspid valve regurgitation is trivial by color flow Doppler. Aortic Valve: The aortic valve is tricuspid Mild thickening of the aortic valve Mild calcification of the aortic valve. Aortic  valve regurgitation is trivial by color flow Doppler. Pulmonic Valve: The pulmonic valve was grossly normal. Pulmonic valve regurgitation is trivial by color flow Doppler. Venous: The inferior vena cava is normal in size with greater than 50% respiratory variability.  LEFT VENTRICLE PLAX 2D LVIDd: 4.70 cm Diastology LVIDs: 3.20 cm LV e' lateral: 8.38 cm/s LV PW: 1.60 cm LV E/e' lateral: 7.0 LV IVS: 1.70 cm LV e' medial: 4.35 cm/s LVOT diam: 2.30 cm LV E/e' medial: 13.4 LV SV: 61 ml LV SV Index: 24.70 LVOT Area: 4.15 cm  RIGHT VENTRICLE RVSP: 26.0 mmHg  LEFT ATRIUM Index RIGHT ATRIUM Index LA diam: 5.10 cm 2.14 cm/m RA Pressure: 3 mmHg LA Vol (A2C): 56.3 ml 23.66 ml/m RA Area: 17.30 cm LA Vol (A4C): 99.2 ml 41.68 ml/m RA Volume: 43.30 ml 18.19 ml/m AORTIC VALVE LVOT Vmax: 116.00 cm/s LVOT Vmean: 86.800 cm/s LVOT VTI: 0.267 m  Labs/Other Tests and Data Reviewed:    EKG:  An ECG dated 04/23/18 was personally reviewed today and demonstrated:  SR 63 and inf. infarct.   Recent Labs: 04/24/2018: B Natriuretic Peptide 42.5; BUN 10; Creatinine, Ser 0.96; Potassium 3.7; Sodium 138 04/25/2018: Hemoglobin 12.8; Platelets 193   Recent Lipid Panel Lab Results  Component Value Date/Time   CHOL 125 04/24/2018 06:30 AM   TRIG 168 (H) 04/24/2018 06:30 AM   HDL 34 (L) 04/24/2018 06:30 AM   CHOLHDL 3.7 04/24/2018 06:30 AM   LDLCALC 57 04/24/2018 06:30 AM    Wt Readings from Last 3 Encounters:  07/10/18 256 lb (116.1 kg)  05/07/18 256 lb 12.8 oz (116.5 kg)  04/24/18 259 lb 8 oz (117.7 kg)     Objective:    Vital Signs:  BP 113/70   Pulse (!) 101   Ht 6' (1.829 m)   Wt 256 lb (116.1 kg)   BMI 34.72 kg/m   Pulse recheck was 63 per pt.   VITAL SIGNS:  reviewed  General: male with NAD Neuro A&O X 3 follows commands. Lungs can speak in complete sentences  without SOB Psych:  Pleasant affect  ASSESSMENT & PLAN:    1. CAD with NSTEMI, still with fatigue but no chest pain.  Decrease toprol XL to 12.5 mg daily.  Follow up with Dr. Johnsie Cancel in 3 months. Continue ASA and effient, [facility] rehab when available. 2. HTN low end, decrease BB and if stays low stop imdur 3. HLD continue statin and check hepatic and lipids  4. Cardiomyopathy with EF 45-50% on ARB, no SOB   COVID-19 Education: The signs and symptoms of COVID-19 were discussed with the patient and how to seek care  for testing (follow up with PCP or arrange E-visit).  The importance of social distancing was discussed today.  Time:   Today, I have spent 10 minutes with the patient with telehealth technology discussing the above problems.     Medication Adjustments/Labs and Tests Ordered: Current medicines are reviewed at length with the patient today.  Concerns regarding medicines are outlined above.   Tests Ordered: No orders of the defined types were placed in this encounter.   Medication Changes: No orders of the defined types were placed in this encounter.   Disposition:  Follow up in 3 month(s)  Signed, Cecilie Kicks, NP  07/10/2018 1:28 PM    [facility]

## 2018-07-10 ENCOUNTER — Telehealth (INDEPENDENT_AMBULATORY_CARE_PROVIDER_SITE_OTHER): Payer: Medicare Other | Admitting: Cardiology

## 2018-07-10 ENCOUNTER — Encounter: Payer: Self-pay | Admitting: Cardiology

## 2018-07-10 ENCOUNTER — Other Ambulatory Visit: Payer: Self-pay

## 2018-07-10 VITALS — BP 113/70 | HR 101 | Ht 72.0 in | Wt 256.0 lb

## 2018-07-10 DIAGNOSIS — I2510 Atherosclerotic heart disease of native coronary artery without angina pectoris: Secondary | ICD-10-CM | POA: Diagnosis not present

## 2018-07-10 DIAGNOSIS — I214 Non-ST elevation (NSTEMI) myocardial infarction: Secondary | ICD-10-CM

## 2018-07-10 DIAGNOSIS — E782 Mixed hyperlipidemia: Secondary | ICD-10-CM

## 2018-07-10 DIAGNOSIS — I10 Essential (primary) hypertension: Secondary | ICD-10-CM

## 2018-07-10 DIAGNOSIS — I429 Cardiomyopathy, unspecified: Secondary | ICD-10-CM

## 2018-07-10 MED ORDER — METOPROLOL SUCCINATE ER 25 MG PO TB24: 12.5000 mg | ORAL_TABLET | Freq: Every day | ORAL | 3 refills | Status: DC

## 2018-07-10 NOTE — Patient Instructions (Addendum)
Medication Instructions:  Your physician has recommended you make the following change in your medication:  1.  REDUCE THE TOPROL XL TO 25 MG TAKING ONLY 1/2 TABLET DAILY    If you need a refill on your cardiac medications before your next appointment, please call your pharmacy.   Lab work: 1 WEEK:  07/19/2018 AT 8:45 A.M:  FASTING LIPID & HEPATIC.  MAKE SURE YOU COME FASTING, NOTHING TO EAT OR DRINK AFTER MIDNIGHT THE NIGHT BEFORE  If you have labs (blood work) drawn today and your tests are completely normal, you will receive your results only by: Marland Kitchen MyChart Message (if you have MyChart) OR . A paper copy in the mail If you have any lab test that is abnormal or we need to change your treatment, we will call you to review the results.  Testing/Procedures: None ordered   Follow-Up: At [facility], you and your health needs are our priority.  As part of our continuing mission to provide you with exceptional heart care, we have created designated Provider Care Teams.  These Care Teams include your primary Cardiologist (physician) and Advanced Practice Providers (APPs -  Physician Assistants and Nurse Practitioners) who all work together to provide you with the care you need, when you need it. You will need a follow up appointment in 3 months.  Please call our office 2 months in advance to schedule this appointment.  You may see Jenkins Rouge, MD or one of the following Advanced Practice Providers on your designated Care Team:   Truitt Merle, NP Cecilie Kicks, NP . Kathyrn Drown, NP  Any Other Special Instructions Will Be Listed Below (If Applicable).

## 2018-07-18 ENCOUNTER — Telehealth: Payer: Self-pay | Admitting: *Deleted

## 2018-07-18 NOTE — Telephone Encounter (Signed)
    COVID-19 Pre-Screening Questions:  . In the past 7 to 10 days have you had a cough,  shortness of breath, headache, congestion, fever (100 or greater) body aches, chills, sore throat, or sudden loss of taste or sense of smell? . Have you been around anyone with known Covid 19. . Have you been around anyone who is awaiting Covid 19 test results in the past 7 to 10 days? . Have you been around anyone who has been exposed to Covid 19, or has mentioned symptoms of Covid 19 within the past 7 to 10 days?  If you have any concerns/questions about symptoms patients report during screening (either on the phone or at threshold). Contact the provider seeing the patient or DOD for further guidance.  If neither are available contact a member of the leadership team.           Contacted patient via telephone call. No to all Covid 19 questions. Has a mask.  KB

## 2018-07-19 ENCOUNTER — Other Ambulatory Visit: Payer: Medicare Other | Admitting: *Deleted

## 2018-07-19 ENCOUNTER — Other Ambulatory Visit: Payer: Self-pay

## 2018-07-19 DIAGNOSIS — E782 Mixed hyperlipidemia: Secondary | ICD-10-CM | POA: Diagnosis not present

## 2018-07-19 LAB — LIPID PANEL
Chol/HDL Ratio: 3.2 ratio (ref 0.0–5.0)
Cholesterol, Total: 105 mg/dL (ref 100–199)
HDL: 33 mg/dL — ABNORMAL LOW (ref >39)
LDL Calculated: 41 mg/dL (ref 0–99)
Triglycerides: 155 mg/dL — ABNORMAL HIGH (ref 0–149)
VLDL Cholesterol Cal: 31 mg/dL (ref 5–40)

## 2018-07-19 LAB — HEPATIC FUNCTION PANEL
ALT: 27 IU/L (ref 0–44)
AST: 26 IU/L (ref 0–40)
Albumin: 4.4 g/dL (ref 3.8–4.8)
Alkaline Phosphatase: 72 IU/L (ref 39–117)
Bilirubin Total: 0.9 mg/dL (ref 0.0–1.2)
Bilirubin, Direct: 0.22 mg/dL (ref 0.00–0.40)
Total Protein: 6.3 g/dL (ref 6.0–8.5)

## 2018-07-22 ENCOUNTER — Telehealth: Payer: Self-pay | Admitting: *Deleted

## 2018-07-22 NOTE — Telephone Encounter (Signed)
-----   Message from [name], NP sent at 07/20/2018  9:21 PM EDT ----- Labs- cholesterol and liver are stable.  Triglycerides are down some, continue to decrease sweets and exercise.

## 2018-07-22 NOTE — Telephone Encounter (Signed)
Pt returned my call and he has been made aware of his lab results. See result note.

## 2018-07-22 NOTE — Telephone Encounter (Signed)
Call placed to pt re: lab results, left a message for her to call back.  

## 2018-11-01 ENCOUNTER — Telehealth (HOSPITAL_COMMUNITY): Payer: Self-pay

## 2018-11-01 NOTE — Telephone Encounter (Signed)
Pt insurance is active and benefits verified through Medicare a/b Co-pay 0, DED $198/$198 met, out of pocket 0/0 met, co-insurance 20%. no pre-authorization required. Passport, 11/01/2018_0 :59am, REF# [id number]  2ndary insurance is active and benefits verified through [place]. Co-pay 0, DED 0/0 met, out of pocket 0/0 met, co-insurance 0. No pre-authorization required. Passport, 11/01/2018_1 :02am, REF# [id number]

## 2018-11-05 ENCOUNTER — Telehealth (HOSPITAL_COMMUNITY): Payer: Self-pay

## 2018-11-05 NOTE — Telephone Encounter (Signed)
Unsuccessful telephone encounter to Mr. Marcus Parsons to confirm his appointment for Cardiac Rehab Orientation 11/07/2018 at 1:30. HIPAA compliant VM message left requesting call back at [phone]. Jessina Marse E. Rollene Rotunda, RN BSN

## 2018-11-05 NOTE — Telephone Encounter (Signed)
Cardiac Rehab Medication Review by a Pharmacist  Does the patient  feel that his/her medications are working for him/her?  yes  Has the patient been experiencing any side effects to the medications prescribed?  no  Does the patient measure his/her own blood pressure or blood glucose at home?  yes   Does the patient have any problems obtaining medications due to transportation or finances?   no  Understanding of regimen: excellent Understanding of indications: excellent Potential of compliance: excellent   Gillian Scarce 11/05/2018 6:24 PM

## 2018-11-06 ENCOUNTER — Telehealth (HOSPITAL_COMMUNITY): Payer: Self-pay | Admitting: *Deleted

## 2018-11-07 ENCOUNTER — Other Ambulatory Visit: Payer: Self-pay

## 2018-11-07 ENCOUNTER — Encounter (HOSPITAL_COMMUNITY)
Admission: RE | Admit: 2018-11-07 | Discharge: 2018-11-07 | Disposition: A | Discharge status: Home / Self Care | Payer: Medicare Other | Source: Ambulatory Visit | Attending: Cardiovascular Disease | Admitting: Cardiovascular Disease

## 2018-11-07 VITALS — BP 124/68 | HR 84 | Temp 97.9°F | Ht 72.0 in | Wt 257.3 lb

## 2018-11-07 DIAGNOSIS — I214 Non-ST elevation (NSTEMI) myocardial infarction: Secondary | ICD-10-CM | POA: Insufficient documentation

## 2018-11-07 NOTE — Progress Notes (Signed)
Cardiac Individual Treatment Plan  Patient Details  Name: Marcus Parsons MRN: [id number] Date of Birth: 1946/02/04 Referring Provider:     CARDIAC REHAB PHASE II ORIENTATION from 11/07/2018 in [place]  Referring Provider  Dr. Johnsie Cancel       Initial Encounter Date:    CARDIAC REHAB PHASE II ORIENTATION from 11/07/2018 in [place]  Date  11/07/18      Visit Diagnosis: NSTEMI (non-ST elevated myocardial infarction) [facility])  Patient's Home Medications on Admission:  Current Outpatient Medications:  .  aspirin EC 81 MG tablet, Take 81 mg by mouth daily., Disp: , Rfl:  .  atorvastatin (LIPITOR) 80 MG tablet, Take 1 tablet (80 mg total) by mouth at bedtime., Disp: 90 tablet, Rfl: 3 .  cholecalciferol (VITAMIN D3) 25 MCG (1000 UT) tablet, Take 1,000 Units by mouth daily., Disp: , Rfl:  .  dutasteride (AVODART) 0.5 MG capsule, Take 1 capsule (0.5 mg total) by mouth daily., Disp: 90 capsule, Rfl: 3 .  isosorbide mononitrate (IMDUR) 30 MG 24 hr tablet, Take 0.5 tablets (15 mg total) by mouth daily., Disp: 45 tablet, Rfl: 3 .  metoprolol succinate (TOPROL XL) 25 MG 24 hr tablet, Take 0.5 tablets (12.5 mg total) by mouth daily., Disp: 45 tablet, Rfl: 3 .  nitroGLYCERIN (NITROSTAT) 0.4 MG SL tablet, Place 1 tablet (0.4 mg total) under the tongue every 5 (five) minutes as needed for chest pain., Disp: 25 tablet, Rfl: 5 .  olmesartan (BENICAR) 40 MG tablet, Take 0.5 tablets (20 mg total) by mouth daily., Disp: 45 tablet, Rfl: 2 .  prasugrel (EFFIENT) 10 MG TABS tablet, Take 1 tablet (10 mg total) by mouth daily., Disp: 90 tablet, Rfl: 3 .  PRESCRIPTION MEDICATION, CPAP: At bedtime, Disp: , Rfl:  .  Probiotic Product (DIGESTIVE ADVANTAGE) CAPS, Take 1 capsule by mouth daily after supper., Disp: , Rfl:  .  Probiotic Product (PROBIOTIC DAILY) CAPS, Take 1 capsule by mouth daily., Disp: 90 capsule, Rfl: 3 .  tamsulosin (FLOMAX) 0.4 MG CAPS  capsule, Take 0.4 mg by mouth daily., Disp: , Rfl:   Past Medical History: Past Medical History:  Diagnosis Date  . Acute myocardial infarction ([place]) 04/2013   INFERIOR  . Atherosclerosis of coronary artery   . Benign prostatic hypertrophy   . Carotid stenosis   . Hyperlipidemia   . Obstructive sleep apnea   . S/P coronary artery stent placement     Tobacco Use: Social History   Tobacco Use  Smoking Status Never Smoker  Smokeless Tobacco Never Used    Labs: Recent Review Flowsheet Data    Labs for ITP Cardiac and Pulmonary Rehab Latest Ref Rng & Units 04/24/2018 07/19/2018   Cholestrol 100 - 199 mg/dL 125 105   LDLCALC 0 - 99 mg/dL 57 41   HDL >39 mg/dL 34(L) 33(L)   Trlycerides 0 - 149 mg/dL 168(H) 155(H)   Hemoglobin A1c 4.8 - 5.6 % 6.0(H) -      Capillary Blood Glucose: No results found for: GLUCAP   Exercise Target Goals: Exercise Program Goal: Individual exercise prescription set using results from initial 6 min walk test and THRR while considering  patient's activity barriers and safety.   Exercise Prescription Goal: Initial exercise prescription builds to 30-45 minutes a day of aerobic activity, 2-3 days per week.  Home exercise guidelines will be given to patient during program as part of exercise prescription that the participant will acknowledge.  Activity  Barriers & Risk Stratification: Activity Barriers & Cardiac Risk Stratification - 11/07/18 1437      Activity Barriers & Cardiac Risk Stratification   Activity Barriers  None    Cardiac Risk Stratification  High       6 Minute Walk: 6 Minute Walk    Row Name 11/07/18 1436         6 Minute Walk   Phase  Initial     Distance  1657 feet     Walk Time  6 minutes     # of Rest Breaks  0     MPH  3.1     METS  3.2     RPE  9     Perceived Dyspnea   0     VO2 Peak  11.49     Symptoms  No     Resting HR  84 bpm     Resting BP  124/68     Resting Oxygen Saturation   97 %     Exercise Oxygen  Saturation  during 6 min walk  96 %     Max Ex. HR  119 bpm     Max Ex. BP  130/70     2 Minute Post BP  120/70        Oxygen Initial Assessment:   Oxygen Re-Evaluation:   Oxygen Discharge (Final Oxygen Re-Evaluation):   Initial Exercise Prescription: Initial Exercise Prescription - 11/07/18 1400      Date of Initial Exercise RX and Referring Provider   Date  11/07/18    Referring Provider  Dr. Johnsie Cancel     Expected Discharge Date  01/03/19      Treadmill   MPH  2.7    Grade  1    Minutes  15      Bike   Level  2.5    Watts  45    Minutes  15    METs  3.23      Prescription Details   Frequency (times per week)  3    Duration  Progress to 30 minutes of continuous aerobic without signs/symptoms of physical distress      Intensity   THRR 40-80% of Max Heartrate  60-120    Ratings of Perceived Exertion  11-13      Progression   Progression  Continue to progress workloads to maintain intensity without signs/symptoms of physical distress.      Resistance Training   Training Prescription  Yes    Weight  4 lbs.     Reps  10-15       Perform Capillary Blood Glucose checks as needed.  Exercise Prescription Changes:   Exercise Comments:   Exercise Goals and Review: Exercise Goals    Row Name 11/07/18 1441             Exercise Goals   Increase Physical Activity  Yes       Intervention  Provide advice, education, support and counseling about physical activity/exercise needs.;Develop an individualized exercise prescription for aerobic and resistive training based on initial evaluation findings, risk stratification, comorbidities and participant's personal goals.       Expected Outcomes  Short Term: Attend rehab on a regular basis to increase amount of physical activity.;Long Term: Add in home exercise to make exercise part of routine and to increase amount of physical activity.;Long Term: Exercising regularly at least 3-5 days a week.       Increase Strength  and Stamina  Yes       Intervention  Provide advice, education, support and counseling about physical activity/exercise needs.;Develop an individualized exercise prescription for aerobic and resistive training based on initial evaluation findings, risk stratification, comorbidities and participant's personal goals.       Expected Outcomes  Short Term: Increase workloads from initial exercise prescription for resistance, speed, and METs.;Short Term: Perform resistance training exercises routinely during rehab and add in resistance training at home;Long Term: Improve cardiorespiratory fitness, muscular endurance and strength as measured by increased METs and functional capacity (6MWT)       Able to understand and use rate of perceived exertion (RPE) scale  Yes       Intervention  Provide education and explanation on how to use RPE scale       Expected Outcomes  Short Term: Able to use RPE daily in rehab to express subjective intensity level;Long Term:  Able to use RPE to guide intensity level when exercising independently       Knowledge and understanding of Target Heart Rate Range (THRR)  Yes       Intervention  Provide education and explanation of THRR including how the numbers were predicted and where they are located for reference       Expected Outcomes  Short Term: Able to state/look up THRR;Long Term: Able to use THRR to govern intensity when exercising independently;Short Term: Able to use daily as guideline for intensity in rehab       Able to check pulse independently  Yes       Intervention  Provide education and demonstration on how to check pulse in carotid and radial arteries.;Review the importance of being able to check your own pulse for safety during independent exercise       Expected Outcomes  Short Term: Able to explain why pulse checking is important during independent exercise;Long Term: Able to check pulse independently and accurately       Understanding of Exercise Prescription  Yes        Intervention  Provide education, explanation, and written materials on patient's individual exercise prescription       Expected Outcomes  Short Term: Able to explain program exercise prescription;Long Term: Able to explain home exercise prescription to exercise independently          Exercise Goals Re-Evaluation :   Discharge Exercise Prescription (Final Exercise Prescription Changes):   Nutrition:  Target Goals: Understanding of nutrition guidelines, daily intake of sodium 1500mg , cholesterol 200mg , calories 30% from fat and 7% or less from saturated fats, daily to have 5 or more servings of fruits and vegetables.  Biometrics: Pre Biometrics - 11/07/18 1441      Pre Biometrics   Height  6' (1.829 m)    Weight  116.7 kg    Waist Circumference  37.5 inches    Hip Circumference  46 inches    Waist to Hip Ratio  0.82 %    BMI (Calculated)  34.89    Triceps Skinfold  34 mm    % Body Fat  31.9 %    Grip Strength  51 kg    Flexibility  0 in    Single Leg Stand  6.06 seconds        Nutrition Therapy Plan and Nutrition Goals:   Nutrition Assessments:   Nutrition Goals Re-Evaluation:   Nutrition Goals Re-Evaluation:   Nutrition Goals Discharge (Final Nutrition Goals Re-Evaluation):   Psychosocial: Target Goals: Acknowledge presence or absence of significant depression and/or  stress, maximize coping skills, provide positive support system. Participant is able to verbalize types and ability to use techniques and skills needed for reducing stress and depression.  Initial Review & Psychosocial Screening: Initial Psych Review & Screening - 11/07/18 1510      Initial Review   Current issues with  None Identified      Family Dynamics   Good Support System?  Yes   Pt states that his wife and family are sources of support.     Barriers   Psychosocial barriers to participate in program  There are no identifiable barriers or psychosocial needs.      Screening  Interventions   Interventions  Encouraged to exercise       Quality of Life Scores: Quality of Life - 11/07/18 1448      Quality of Life   Select  Quality of Life      Quality of Life Scores   Health/Function Pre  25.2 %    Socioeconomic Pre  30 %    Psych/Spiritual Pre  30 %    Family Pre  30 %    GLOBAL Pre  27.94 %      Scores of 19 and below usually indicate a poorer quality of life in these areas.  A difference of  2-3 points is a clinically meaningful difference.  A difference of 2-3 points in the total score of the Quality of Life Index has been associated with significant improvement in overall quality of life, self-image, physical symptoms, and general health in studies assessing change in quality of life.  PHQ-9: Recent Review Flowsheet Data    Depression screen [facility] 2/9 11/07/2018   Decreased Interest 0   Down, Depressed, Hopeless 0   PHQ - 2 Score 0     Interpretation of Total Score  Total Score Depression Severity:  1-4 = Minimal depression, 5-9 = Mild depression, 10-14 = Moderate depression, 15-19 = Moderately severe depression, 20-27 = Severe depression   Psychosocial Evaluation and Intervention:   Psychosocial Re-Evaluation:   Psychosocial Discharge (Final Psychosocial Re-Evaluation):   Vocational Rehabilitation: Provide vocational rehab assistance to qualifying candidates.   Vocational Rehab Evaluation & Intervention: Vocational Rehab - 11/07/18 1510      Initial Vocational Rehab Evaluation & Intervention   Assessment shows need for Vocational Rehabilitation  No       Education: Education Goals: Education classes will be provided on a weekly basis, covering required topics. Participant will state understanding/return demonstration of topics presented.  Learning Barriers/Preferences: Learning Barriers/Preferences - 11/07/18 1442      Learning Barriers/Preferences   Learning Barriers  None    Learning Preferences  Audio;Computer/Internet;Group  Instruction;Individual Instruction;Skilled Demonstration;Pictoral;Verbal Instruction;Video;Written Material       Education Topics: Count Your Pulse:  -Group instruction provided by verbal instruction, demonstration, patient participation and written materials to support subject.  Instructors address importance of being able to find your pulse and how to count your pulse when at home without a heart monitor.  Patients get hands on experience counting their pulse with staff help and individually.   Heart Attack, Angina, and Risk Factor Modification:  -Group instruction provided by verbal instruction, video, and written materials to support subject.  Instructors address signs and symptoms of angina and heart attacks.    Also discuss risk factors for heart disease and how to make changes to improve heart health risk factors.   Functional Fitness:  -Group instruction provided by verbal instruction, demonstration, patient participation, and written materials  to support subject.  Instructors address safety measures for doing things around the house.  Discuss how to get up and down off the floor, how to pick things up properly, how to safely get out of a chair without assistance, and balance training.   Meditation and Mindfulness:  -Group instruction provided by verbal instruction, patient participation, and written materials to support subject.  Instructor addresses importance of mindfulness and meditation practice to help reduce stress and improve awareness.  Instructor also leads participants through a meditation exercise.    Stretching for Flexibility and Mobility:  -Group instruction provided by verbal instruction, patient participation, and written materials to support subject.  Instructors lead participants through series of stretches that are designed to increase flexibility thus improving mobility.  These stretches are additional exercise for major muscle groups that are typically performed  during regular warm up and cool down.   Hands Only CPR:  -Group verbal, video, and participation provides a basic overview of AHA guidelines for community CPR. Role-play of emergencies allow participants the opportunity to practice calling for help and chest compression technique with discussion of AED use.   Hypertension: -Group verbal and written instruction that provides a basic overview of hypertension including the most recent diagnostic guidelines, risk factor reduction with self-care instructions and medication management.    Nutrition I class: Heart Healthy Eating:  -Group instruction provided by PowerPoint slides, verbal discussion, and written materials to support subject matter. The instructor gives an explanation and review of the Therapeutic Lifestyle Changes diet recommendations, which includes a discussion on lipid goals, dietary fat, sodium, fiber, plant stanol/sterol esters, sugar, and the components of a well-balanced, healthy diet.   Nutrition II class: Lifestyle Skills:  -Group instruction provided by PowerPoint slides, verbal discussion, and written materials to support subject matter. The instructor gives an explanation and review of label reading, grocery shopping for heart health, heart healthy recipe modifications, and ways to make healthier choices when eating out.   Diabetes Question & Answer:  -Group instruction provided by PowerPoint slides, verbal discussion, and written materials to support subject matter. The instructor gives an explanation and review of diabetes co-morbidities, pre- and post-prandial blood glucose goals, pre-exercise blood glucose goals, signs, symptoms, and treatment of hypoglycemia and hyperglycemia, and foot care basics.   Diabetes Blitz:  -Group instruction provided by PowerPoint slides, verbal discussion, and written materials to support subject matter. The instructor gives an explanation and review of the physiology behind type 1 and  type 2 diabetes, diabetes medications and rational behind using different medications, pre- and post-prandial blood glucose recommendations and Hemoglobin A1c goals, diabetes diet, and exercise including blood glucose guidelines for exercising safely.    Portion Distortion:  -Group instruction provided by PowerPoint slides, verbal discussion, written materials, and food models to support subject matter. The instructor gives an explanation of serving size versus portion size, changes in portions sizes over the last 20 years, and what consists of a serving from each food group.   Stress Management:  -Group instruction provided by verbal instruction, video, and written materials to support subject matter.  Instructors review role of stress in heart disease and how to cope with stress positively.     Exercising on Your Own:  -Group instruction provided by verbal instruction, power point, and written materials to support subject.  Instructors discuss benefits of exercise, components of exercise, frequency and intensity of exercise, and end points for exercise.  Also discuss use of nitroglycerin and activating EMS.  Review options of  places to exercise outside of rehab.  Review guidelines for sex with heart disease.   Cardiac Drugs I:  -Group instruction provided by verbal instruction and written materials to support subject.  Instructor reviews cardiac drug classes: antiplatelets, anticoagulants, beta blockers, and statins.  Instructor discusses reasons, side effects, and lifestyle considerations for each drug class.   Cardiac Drugs II:  -Group instruction provided by verbal instruction and written materials to support subject.  Instructor reviews cardiac drug classes: angiotensin converting enzyme inhibitors (ACE-I), angiotensin II receptor blockers (ARBs), nitrates, and calcium channel blockers.  Instructor discusses reasons, side effects, and lifestyle considerations for each drug  class.   Anatomy and Physiology of the Circulatory System:  Group verbal and written instruction and models provide basic cardiac anatomy and physiology, with the coronary electrical and arterial systems. Review of: AMI, Angina, Valve disease, Heart Failure, Peripheral Artery Disease, Cardiac Arrhythmia, Pacemakers, and the ICD.   Other Education:  -Group or individual verbal, written, or video instructions that support the educational goals of the cardiac rehab program.   Holiday Eating Survival Tips:  -Group instruction provided by PowerPoint slides, verbal discussion, and written materials to support subject matter. The instructor gives patients tips, tricks, and techniques to help them not only survive but enjoy the holidays despite the onslaught of food that accompanies the holidays.   Knowledge Questionnaire Score: Knowledge Questionnaire Score - 11/07/18 1442      Knowledge Questionnaire Score   Pre Score  23/24       Core Components/Risk Factors/Patient Goals at Admission: Personal Goals and Risk Factors at Admission - 11/07/18 1442      Core Components/Risk Factors/Patient Goals on Admission    Weight Management  Yes;Obesity;Weight Maintenance;Weight Loss    Intervention  Weight Management: Develop a combined nutrition and exercise program designed to reach desired caloric intake, while maintaining appropriate intake of nutrient and fiber, sodium and fats, and appropriate energy expenditure required for the weight goal.;Weight Management: Provide education and appropriate resources to help participant work on and attain dietary goals.;Weight Management/Obesity: Establish reasonable short term and long term weight goals.;Obesity: Provide education and appropriate resources to help participant work on and attain dietary goals.    Admit Weight  257 lb 4.4 oz (116.7 kg)    Expected Outcomes  Short Term: Continue to assess and modify interventions until short term weight is  achieved;Long Term: Adherence to nutrition and physical activity/exercise program aimed toward attainment of established weight goal;Weight Maintenance: Understanding of the daily nutrition guidelines, which includes 25-35% calories from fat, 7% or less cal from saturated fats, less than 200mg  cholesterol, less than 1.5gm of sodium, & 5 or more servings of fruits and vegetables daily;Weight Loss: Understanding of general recommendations for a balanced deficit meal plan, which promotes 1-2 lb weight loss per week and includes a negative energy balance of [phone] kcal/d;Understanding recommendations for meals to include 15-35% energy as protein, 25-35% energy from fat, 35-60% energy from carbohydrates, less than 200mg  of dietary cholesterol, 20-35 gm of total fiber daily;Understanding of distribution of calorie intake throughout the day with the consumption of 4-5 meals/snacks    Hypertension  Yes    Intervention  Monitor prescription use compliance.;Provide education on lifestyle modifcations including regular physical activity/exercise, weight management, moderate sodium restriction and increased consumption of fresh fruit, vegetables, and low fat dairy, alcohol moderation, and smoking cessation.    Expected Outcomes  Short Term: Continued assessment and intervention until BP is < 140/65mm HG in hypertensive participants. < 130/23mm  HG in hypertensive participants with diabetes, heart failure or chronic kidney disease.;Long Term: Maintenance of blood pressure at goal levels.    Lipids  Yes    Intervention  Provide education and support for participant on nutrition & aerobic/resistive exercise along with prescribed medications to achieve LDL 70mg , HDL >40mg .    Expected Outcomes  Short Term: Participant states understanding of desired cholesterol values and is compliant with medications prescribed. Participant is following exercise prescription and nutrition guidelines.;Long Term: Cholesterol controlled with  medications as prescribed, with individualized exercise RX and with personalized nutrition plan. Value goals: LDL < 70mg , HDL > 40 mg.       Core Components/Risk Factors/Patient Goals Review:    Core Components/Risk Factors/Patient Goals at Discharge (Final Review):    ITP Comments: ITP Comments    Row Name 11/07/18 1509           ITP Comments  Dr. Fransico Him, Medical Director          Comments: Patient attended orientation on 11/07/2018 to review rules and guidelines for program.  Completed 6 minute walk test, Intitial ITP, and exercise prescription.  VSS. Telemetry-SR with ventricular trigeminy.  Asymptomatic. Rhythm strips faxed to Dr. Kyla Balzarine office for review.  Safety measures and social distancing in place per CDC guidelines.

## 2018-11-11 ENCOUNTER — Telehealth: Payer: Self-pay | Admitting: *Deleted

## 2018-11-11 ENCOUNTER — Encounter (HOSPITAL_COMMUNITY)
Admission: RE | Admit: 2018-11-11 | Discharge: 2018-11-11 | Disposition: A | Discharge status: Home / Self Care | Payer: Medicare Other | Source: Ambulatory Visit | Attending: Cardiovascular Disease | Admitting: Cardiovascular Disease

## 2018-11-11 ENCOUNTER — Other Ambulatory Visit: Payer: Self-pay

## 2018-11-11 ENCOUNTER — Telehealth: Payer: Self-pay

## 2018-11-11 DIAGNOSIS — I214 Non-ST elevation (NSTEMI) myocardial infarction: Secondary | ICD-10-CM | POA: Diagnosis not present

## 2018-11-11 DIAGNOSIS — R9431 Abnormal electrocardiogram [ECG] [EKG]: Secondary | ICD-10-CM

## 2018-11-11 NOTE — Telephone Encounter (Signed)
Left message for patient to call back  

## 2018-11-11 NOTE — Telephone Encounter (Signed)
-----   Message from Josue Hector, MD sent at 11/10/2018  9:49 AM EDT ----- Regarding: FW: Cardiac Rehab Pt Rhythm Would get 48 hour monitor to quantitate PVC;s ----- Message ----- From: Noel Christmas, RN Sent: 11/07/2018   3:14 PM EDT To: Josue Hector, MD Subject: Cardiac Rehab Pt Rhythm                        Dr. Johnsie Cancel,  I wanted to make you aware of Mr. Barrus rhythm when he presented to cardiac rehab orientation today.  He was in sinus rhythm with ventricular trigeminy at rest.  He was asymptomatic and BP was 124/68.  HR 84.  During his 6MWT, his HR increased to 119 with continued trigeminy and every fourth beat PVC occasionally.  He was asymptomatic during the 6MWT as well.  I faxed the rhythm strips to the office for your review.    Thank you, Noel Christmas

## 2018-11-11 NOTE — Telephone Encounter (Signed)
3 day ZIO XT long term holter monitor to be mailed to the patients home.  Instructions reviewed briefly as they are included in the monitor kit. 

## 2018-11-11 NOTE — Progress Notes (Signed)
Daily Session Note  Patient Details  Name: Marcus Parsons MRN: [id number] Date of Birth: 10/19/1945 Referring Provider:     CARDIAC REHAB PHASE II ORIENTATION from 11/07/2018 in [place]  Referring Provider  Dr. Johnsie Cancel       Encounter Date: 11/11/2018  Check In: Session Check In - 11/11/18 0852      Check-In   Supervising physician immediately available to respond to emergencies  [facility] immediately available    Physician(s)  Dr. Marthenia Rolling    Location  [facility]    Staff Present  Deitra Mayo, BS, ACSM CEP, Exercise Physiologist;Nelda Luckey Karle Starch, RN, Bjorn Loser, MS, Exercise Physiologist;Portia Rollene Rotunda, RN, Deland Pretty, MS, ACSM CEP, Exercise Physiologist    Virtual Visit  No    Medication changes reported      No    Fall or balance concerns reported     No    Tobacco Cessation  No Change    Warm-up and Cool-down  Performed on first and last piece of equipment    Resistance Training Performed  Yes    VAD Patient?  No    PAD/SET Patient?  No      Pain Assessment   Currently in Pain?  No/denies    Pain Score  0-No pain    Multiple Pain Sites  No       Capillary Blood Glucose: No results found for this or any previous visit (from the past 24 hour(s)).  Exercise Prescription Changes - 11/11/18 1100      Response to Exercise   Blood Pressure (Admit)  108/68    Blood Pressure (Exercise)  128/70    Blood Pressure (Exit)  98/50    Heart Rate (Admit)  88 bpm    Heart Rate (Exercise)  121 bpm    Heart Rate (Exit)  89 bpm    Rating of Perceived Exertion (Exercise)  11    Symptoms  none    Comments  Pt first day of exercise.     Duration  Continue with 30 min of aerobic exercise without signs/symptoms of physical distress.    Intensity  THRR unchanged      Progression   Progression  Continue to progress workloads to maintain intensity without signs/symptoms of physical distress.    Average METs  3.2       Resistance Training   Training Prescription  Yes    Weight  4 lbs.     Reps  10-15    Time  10 Minutes      Interval Training   Interval Training  No      Treadmill   MPH  2.9    Grade  1    Minutes  15      Bike   Level  2.5    Watts  45    Minutes  15    METs  2.8       Social History   Tobacco Use  Smoking Status Never Smoker  Smokeless Tobacco Never Used    Goals Met:  Exercise tolerated well  Goals Unmet:  Not Applicable  Comments: Pt started cardiac rehab today.  Pt tolerated light exercise without difficulty. VSS, telemetry-NSR with trigeminy, asymptomatic.  Medication list reconciled. Pt denies barriers to medicaiton compliance.  PSYCHOSOCIAL ASSESSMENT:  PHQ-0. Pt exhibits positive coping skills, hopeful outlook with supportive family. No psychosocial needs identified at this time, no psychosocial interventions necessary.  Pt oriented to exercise equipment and  routine.    Understanding verbalized.    Dr. Fransico Him is Medical Director for Cardiac Rehab at [facility].

## 2018-11-11 NOTE — Telephone Encounter (Signed)
Informed patient that Dr. Johnsie Cancel wants to order a 48 hour holter monitor. Informed patient that someone will call to schedule appt for the monitor. Patient verbalized understanding.

## 2018-11-13 ENCOUNTER — Other Ambulatory Visit: Payer: Self-pay

## 2018-11-13 ENCOUNTER — Encounter (HOSPITAL_COMMUNITY)
Admission: RE | Admit: 2018-11-13 | Discharge: 2018-11-13 | Disposition: A | Discharge status: Home / Self Care | Payer: Medicare Other | Source: Ambulatory Visit | Attending: Cardiovascular Disease | Admitting: Cardiovascular Disease

## 2018-11-13 DIAGNOSIS — I214 Non-ST elevation (NSTEMI) myocardial infarction: Secondary | ICD-10-CM

## 2018-11-15 ENCOUNTER — Encounter (HOSPITAL_COMMUNITY)
Admission: RE | Admit: 2018-11-15 | Discharge: 2018-11-15 | Disposition: A | Discharge status: Home / Self Care | Payer: Medicare Other | Source: Ambulatory Visit | Attending: Cardiovascular Disease | Admitting: Cardiovascular Disease

## 2018-11-15 ENCOUNTER — Ambulatory Visit (INDEPENDENT_AMBULATORY_CARE_PROVIDER_SITE_OTHER): Payer: Medicare Other

## 2018-11-15 ENCOUNTER — Other Ambulatory Visit: Payer: Self-pay

## 2018-11-15 DIAGNOSIS — R9431 Abnormal electrocardiogram [ECG] [EKG]: Secondary | ICD-10-CM

## 2018-11-15 DIAGNOSIS — I214 Non-ST elevation (NSTEMI) myocardial infarction: Secondary | ICD-10-CM

## 2018-11-15 DIAGNOSIS — I493 Ventricular premature depolarization: Secondary | ICD-10-CM | POA: Diagnosis not present

## 2018-11-18 ENCOUNTER — Encounter (HOSPITAL_COMMUNITY)
Admission: RE | Admit: 2018-11-18 | Discharge: 2018-11-18 | Disposition: A | Discharge status: Home / Self Care | Payer: Medicare Other | Source: Ambulatory Visit | Attending: Cardiovascular Disease | Admitting: Cardiovascular Disease

## 2018-11-18 ENCOUNTER — Other Ambulatory Visit: Payer: Medicare Other | Admitting: *Deleted

## 2018-11-18 ENCOUNTER — Other Ambulatory Visit: Payer: Self-pay | Admitting: Cardiology

## 2018-11-18 ENCOUNTER — Other Ambulatory Visit: Payer: Self-pay

## 2018-11-18 DIAGNOSIS — I493 Ventricular premature depolarization: Secondary | ICD-10-CM

## 2018-11-18 DIAGNOSIS — I214 Non-ST elevation (NSTEMI) myocardial infarction: Secondary | ICD-10-CM | POA: Diagnosis not present

## 2018-11-18 LAB — BASIC METABOLIC PANEL
BUN/Creatinine Ratio: 13 (ref 10–24)
BUN: 14 mg/dL (ref 8–27)
CO2: 28 mmol/L (ref 20–29)
Calcium: 9 mg/dL (ref 8.6–10.2)
Chloride: 104 mmol/L (ref 96–106)
Creatinine, Ser: 1.08 mg/dL (ref 0.76–1.27)
GFR calc Af Amer: 80 mL/min/{1.73_m2} (ref >59)
GFR calc non Af Amer: 69 mL/min/{1.73_m2} (ref >59)
Glucose: 200 mg/dL — ABNORMAL HIGH (ref 65–99)
Potassium: 4.1 mmol/L (ref 3.5–5.2)
Sodium: 140 mmol/L (ref 134–144)

## 2018-11-18 LAB — MAGNESIUM: Magnesium: 1.8 mg/dL (ref 1.6–2.3)

## 2018-11-18 NOTE — Progress Notes (Signed)
I was called by Marcus Parsons at cardiac rehab.  After the patient finished exercising on the seated bike he had a blood pressure of 90/60 with heart rate of 96.  He felt mildly lightheaded.  He admitted to not drinking much this morning.  He was given Gatorade and blood pressure up to 104/60.  Staff reports that patient is having frequent PVCs.  Patient did wear a 3-day ZIO patch monitor over the weekend and sent it back in.  I will have patient go to the office today for a basic metabolic panel to check potassium and magnesium.  I advised that patient can attend cardiac rehab on Wednesday but he should make sure he eats breakfast and drink some fluids before his session.  I will send a message to scheduling to have the patient seen in the next week or so to review his monitor results and current symptoms.

## 2018-11-18 NOTE — Progress Notes (Signed)
Post exercise blood pressure 90/50. Patient asymptomatic now but reported feeling lightheaded after getting off the scifit bike. Patient was given Gatorade recheck blood pressure 104/60 sitting. Standing blood pressure 94/60 heart rate 112. Pecolia Ades NP paged and notified. Patient said he wore a monitor over the weekend. Gae Bon ordered a BMET and a Mag level for the patient. Mr Bibeau is going to Dr Kyla Balzarine office to get blood work drawn. Patient reported that he ate cheerio's for breakfast this morning and did not drink much water. Patient instructed to make sure that he eats and drinks water before reporting to exercise at cardiac rehab. Patient states understanding. Exit blood pressure 98/72 heart rate 96. Patient had an additional 8oz of water before leaving cardiac rehab and had not complaints.Barnet Pall, RN,BSN 11/18/2018 2:02 PM

## 2018-11-19 DIAGNOSIS — Z23 Encounter for immunization: Secondary | ICD-10-CM | POA: Diagnosis not present

## 2018-11-20 ENCOUNTER — Other Ambulatory Visit: Payer: Self-pay

## 2018-11-20 ENCOUNTER — Encounter (HOSPITAL_COMMUNITY)
Admission: RE | Admit: 2018-11-20 | Discharge: 2018-11-20 | Disposition: A | Discharge status: Home / Self Care | Payer: Medicare Other | Source: Ambulatory Visit | Attending: Cardiovascular Disease | Admitting: Cardiovascular Disease

## 2018-11-20 DIAGNOSIS — I214 Non-ST elevation (NSTEMI) myocardial infarction: Secondary | ICD-10-CM | POA: Diagnosis not present

## 2018-11-22 ENCOUNTER — Other Ambulatory Visit: Payer: Self-pay

## 2018-11-22 ENCOUNTER — Encounter (HOSPITAL_COMMUNITY)
Admission: RE | Admit: 2018-11-22 | Discharge: 2018-11-22 | Disposition: A | Discharge status: Home / Self Care | Payer: Medicare Other | Source: Ambulatory Visit | Attending: Cardiovascular Disease | Admitting: Cardiovascular Disease

## 2018-11-22 DIAGNOSIS — I214 Non-ST elevation (NSTEMI) myocardial infarction: Secondary | ICD-10-CM | POA: Diagnosis not present

## 2018-11-22 NOTE — Progress Notes (Signed)
I have reviewed a Home Exercise Prescription with Marcus Parsons . Marcus Parsons is currently exercising at home by walking.  The patient was advised to walk 5-7 days a week for 30-45 minutes.  Marcus Parsons and I discussed how to progress their exercise prescription.  The patient stated that their goals were to continue to walk at home 2-4 days for 30-45 minutes in addition to CR program.  The patient stated that they understand the exercise prescription.  We reviewed exercise guidelines, target heart rate during exercise, RPE Scale, weather conditions, NTG use, endpoints for exercise, warmup and cool down.  Patient is encouraged to come to me with any questions. I will continue to follow up with the patient to assist them with progression and safety.    Deitra Mayo BS, ACSM CEP 11/22/2018 10:36 AM

## 2018-11-25 ENCOUNTER — Encounter (HOSPITAL_COMMUNITY)
Admission: RE | Admit: 2018-11-25 | Discharge: 2018-11-25 | Disposition: A | Discharge status: Home / Self Care | Payer: Medicare Other | Source: Ambulatory Visit | Attending: Cardiovascular Disease | Admitting: Cardiovascular Disease

## 2018-11-25 ENCOUNTER — Other Ambulatory Visit: Payer: Self-pay

## 2018-11-25 DIAGNOSIS — I214 Non-ST elevation (NSTEMI) myocardial infarction: Secondary | ICD-10-CM

## 2018-11-26 NOTE — Progress Notes (Signed)
Cardiac Individual Treatment Plan  Patient Details  Name: Marcus Parsons MRN: [id number] Date of Birth: March 30, 1945 Referring Provider:     CARDIAC REHAB PHASE II ORIENTATION from 11/07/2018 in [facility]  Referring Provider  Dr. Johnsie Cancel       Initial Encounter Date:    CARDIAC REHAB PHASE II ORIENTATION from 11/07/2018 in [facility]  Date  11/07/18      Visit Diagnosis: NSTEMI (non-ST elevated myocardial infarction) [facility])  Patient's Home Medications on Admission:  Current Outpatient Medications:  .  aspirin EC 81 MG tablet, Take 81 mg by mouth daily., Disp: , Rfl:  .  atorvastatin (LIPITOR) 80 MG tablet, Take 1 tablet (80 mg total) by mouth at bedtime., Disp: 90 tablet, Rfl: 3 .  cholecalciferol (VITAMIN D3) 25 MCG (1000 UT) tablet, Take 1,000 Units by mouth daily., Disp: , Rfl:  .  dutasteride (AVODART) 0.5 MG capsule, Take 1 capsule (0.5 mg total) by mouth daily., Disp: 90 capsule, Rfl: 3 .  isosorbide mononitrate (IMDUR) 30 MG 24 hr tablet, Take 0.5 tablets (15 mg total) by mouth daily., Disp: 45 tablet, Rfl: 3 .  metoprolol succinate (TOPROL XL) 25 MG 24 hr tablet, Take 0.5 tablets (12.5 mg total) by mouth daily., Disp: 45 tablet, Rfl: 3 .  nitroGLYCERIN (NITROSTAT) 0.4 MG SL tablet, Place 1 tablet (0.4 mg total) under the tongue every 5 (five) minutes as needed for chest pain., Disp: 25 tablet, Rfl: 5 .  olmesartan (BENICAR) 40 MG tablet, Take 0.5 tablets (20 mg total) by mouth daily., Disp: 45 tablet, Rfl: 2 .  prasugrel (EFFIENT) 10 MG TABS tablet, Take 1 tablet (10 mg total) by mouth daily., Disp: 90 tablet, Rfl: 3 .  PRESCRIPTION MEDICATION, CPAP: At bedtime, Disp: , Rfl:  .  Probiotic Product (DIGESTIVE ADVANTAGE) CAPS, Take 1 capsule by mouth daily after supper., Disp: , Rfl:  .  Probiotic Product (PROBIOTIC DAILY) CAPS, Take 1 capsule by mouth daily., Disp: 90 capsule, Rfl: 3 .  tamsulosin (FLOMAX) 0.4 MG CAPS  capsule, Take 0.4 mg by mouth daily., Disp: , Rfl:   Past Medical History: Past Medical History:  Diagnosis Date  . Acute myocardial infarction ([place]) 04/2013   INFERIOR  . Atherosclerosis of coronary artery   . Benign prostatic hypertrophy   . Carotid stenosis   . Hyperlipidemia   . Obstructive sleep apnea   . S/P coronary artery stent placement     Tobacco Use: Social History   Tobacco Use  Smoking Status Never Smoker  Smokeless Tobacco Never Used    Labs: Recent Review Flowsheet Data    Labs for ITP Cardiac and Pulmonary Rehab Latest Ref Rng & Units 04/24/2018 07/19/2018   Cholestrol 100 - 199 mg/dL 125 105   LDLCALC 0 - 99 mg/dL 57 41   HDL >39 mg/dL 34(L) 33(L)   Trlycerides 0 - 149 mg/dL 168(H) 155(H)   Hemoglobin A1c 4.8 - 5.6 % 6.0(H) -      Capillary Blood Glucose: No results found for: GLUCAP   Exercise Target Goals: Exercise Program Goal: Individual exercise prescription set using results from initial 6 min walk test and THRR while considering  patient's activity barriers and safety.   Exercise Prescription Goal: Starting with aerobic activity 30 plus minutes a day, 3 days per week for initial exercise prescription. Provide home exercise prescription and guidelines that participant acknowledges understanding prior to discharge.  Activity Barriers & Risk Stratification: Activity Barriers &  Cardiac Risk Stratification - 11/07/18 1437      Activity Barriers & Cardiac Risk Stratification   Activity Barriers  None    Cardiac Risk Stratification  High       6 Minute Walk: 6 Minute Walk    Row Name 11/07/18 1436         6 Minute Walk   Phase  Initial     Distance  1657 feet     Walk Time  6 minutes     # of Rest Breaks  0     MPH  3.1     METS  3.2     RPE  9     Perceived Dyspnea   0     VO2 Peak  11.49     Symptoms  No     Resting HR  84 bpm     Resting BP  124/68     Resting Oxygen Saturation   97 %     Exercise Oxygen Saturation  during 6  min walk  96 %     Max Ex. HR  119 bpm     Max Ex. BP  130/70     2 Minute Post BP  120/70        Oxygen Initial Assessment:   Oxygen Re-Evaluation:   Oxygen Discharge (Final Oxygen Re-Evaluation):   Initial Exercise Prescription: Initial Exercise Prescription - 11/07/18 1400      Date of Initial Exercise RX and Referring Provider   Date  11/07/18    Referring Provider  Dr. Johnsie Cancel     Expected Discharge Date  01/03/19      Treadmill   MPH  2.7    Grade  1    Minutes  15      Bike   Level  2.5    Watts  45    Minutes  15    METs  3.23      Prescription Details   Frequency (times per week)  3    Duration  Progress to 30 minutes of continuous aerobic without signs/symptoms of physical distress      Intensity   THRR 40-80% of Max Heartrate  60-120    Ratings of Perceived Exertion  11-13      Progression   Progression  Continue to progress workloads to maintain intensity without signs/symptoms of physical distress.      Resistance Training   Training Prescription  Yes    Weight  4 lbs.     Reps  10-15       Perform Capillary Blood Glucose checks as needed.  Exercise Prescription Changes:  Exercise Prescription Changes    Row Name 11/11/18 1100 11/22/18 1000 11/25/18 1100         Response to Exercise   Blood Pressure (Admit)  108/68  110/58  112/60     Blood Pressure (Exercise)  128/70  130/70  144/78     Blood Pressure (Exit)  98/50  106/62  98/60     Heart Rate (Admit)  88 bpm  99 bpm  98 bpm     Heart Rate (Exercise)  121 bpm  137 bpm  144 bpm     Heart Rate (Exit)  89 bpm  103 bpm  90 bpm     Rating of Perceived Exertion (Exercise)  _0 Symptoms  none  none  none     Comments  Pt first day of exercise.   -  Reviewed METs and goals      Duration  Continue with 30 min of aerobic exercise without signs/symptoms of physical distress.  Continue with 30 min of aerobic exercise without signs/symptoms of physical distress.  Continue with 30 min  of aerobic exercise without signs/symptoms of physical distress.     Intensity  THRR unchanged  THRR unchanged  THRR New 60-135       Progression   Progression  Continue to progress workloads to maintain intensity without signs/symptoms of physical distress.  Continue to progress workloads to maintain intensity without signs/symptoms of physical distress.  Continue to progress workloads to maintain intensity without signs/symptoms of physical distress.     Average METs  3._0 Resistance Training   Training Prescription  Yes  Yes  Yes     Weight  4 lbs.   4 lbs.   4 lbs.      Reps  10-15  10-15  10-15     Time  10 Minutes  10 Minutes  10 Minutes       Interval Training   Interval Training  No  No  No       Treadmill   MPH  2.9  2.9  2.9     Grade  _1 Minutes  _2 Bike   Level  2._3 Watts  45  -  -     Minutes  _4 METs  2.8  4.4  4.4       Home Exercise Plan   Plans to continue exercise at  -  Home (comment)  Home (comment)     Frequency  -  Add 4 additional days to program exercise sessions.  Add 4 additional days to program exercise sessions.     Initial Home Exercises Provided  -  11/22/18  11/22/18        Exercise Comments:  Exercise Comments    Row Name 11/11/18 1126 11/22/18 1034 11/25/18 1202       Exercise Comments  Pt first day of exercise in CR program. Pt tolerated exercise Rx well.  Reviewed HEP with Pt. Pt understands goals.  Reviewed METs and goals with Pt. Pt understands goals for exercsie at home as well as MET level.        Exercise Goals and Review:  Exercise Goals    Row Name 11/07/18 1441             Exercise Goals   Increase Physical Activity  Yes       Intervention  Provide advice, education, support and counseling about physical activity/exercise needs.;Develop an individualized exercise prescription for aerobic and resistive training based on initial evaluation findings, risk  stratification, comorbidities and participant's personal goals.       Expected Outcomes  Short Term: Attend rehab on a regular basis to increase amount of physical activity.;Long Term: Add in home exercise to make exercise part of routine and to increase amount of physical activity.;Long Term: Exercising regularly at least 3-5 days a week.       Increase Strength and Stamina  Yes       Intervention  Provide advice, education, support and counseling about physical activity/exercise needs.;Develop an individualized exercise prescription for aerobic and resistive training based on initial  evaluation findings, risk stratification, comorbidities and participant's personal goals.       Expected Outcomes  Short Term: Increase workloads from initial exercise prescription for resistance, speed, and METs.;Short Term: Perform resistance training exercises routinely during rehab and add in resistance training at home;Long Term: Improve cardiorespiratory fitness, muscular endurance and strength as measured by increased METs and functional capacity (6MWT)       Able to understand and use rate of perceived exertion (RPE) scale  Yes       Intervention  Provide education and explanation on how to use RPE scale       Expected Outcomes  Short Term: Able to use RPE daily in rehab to express subjective intensity level;Long Term:  Able to use RPE to guide intensity level when exercising independently       Knowledge and understanding of Target Heart Rate Range (THRR)  Yes       Intervention  Provide education and explanation of THRR including how the numbers were predicted and where they are located for reference       Expected Outcomes  Short Term: Able to state/look up THRR;Long Term: Able to use THRR to govern intensity when exercising independently;Short Term: Able to use daily as guideline for intensity in rehab       Able to check pulse independently  Yes       Intervention  Provide education and demonstration on how to  check pulse in carotid and radial arteries.;Review the importance of being able to check your own pulse for safety during independent exercise       Expected Outcomes  Short Term: Able to explain why pulse checking is important during independent exercise;Long Term: Able to check pulse independently and accurately       Understanding of Exercise Prescription  Yes       Intervention  Provide education, explanation, and written materials on patient's individual exercise prescription       Expected Outcomes  Short Term: Able to explain program exercise prescription;Long Term: Able to explain home exercise prescription to exercise independently          Exercise Goals Re-Evaluation : Exercise Goals Re-Evaluation    Row Name 11/11/18 1125 11/22/18 1032 11/25/18 1159         Exercise Goal Re-Evaluation   Exercise Goals Review  Increase Physical Activity;Increase Strength and Stamina;Able to understand and use rate of perceived exertion (RPE) scale;Knowledge and understanding of Target Heart Rate Range (THRR);Understanding of Exercise Prescription  Increase Physical Activity;Increase Strength and Stamina;Able to understand and use rate of perceived exertion (RPE) scale;Knowledge and understanding of Target Heart Rate Range (THRR);Able to check pulse independently;Understanding of Exercise Prescription  Increase Physical Activity;Understanding of Exercise Prescription;Increase Strength and Stamina;Knowledge and understanding of Target Heart Rate Range (THRR);Able to understand and use rate of perceived exertion (RPE) scale;Able to check pulse independently     Comments  Pt first day of CR program. Pt tolerated exercise well. Pt increased his treadmill speed to 2.9. Pt understands THRR, RPE scale, and exercise Rx.  Reviewed HEP with Pt. Pt expressed understanding of goals, THRR, RPE scale, weather precautions, end points of exercise, warm up and cool down stretches. Pt is currently walking at home 30-45  minutes 2-4 days in addition to CR program.  Reviewed METs and goals with Pt. Pt understands goals for exercise at home as well as MET level of 4.0. Pt continues to increase MET level and workloads. Pt is tolerating exercsie Rx well. Pt  is exercising at home by walking 2-4 days for 30-45 minutes in addition to CR program.     Expected Outcomes  Will continue to monitor and progress Pt as tolerated.  Will continue to monitor and progress Pt as tolerated.  Will continue to monitor and progress Pt as tolerated.         Discharge Exercise Prescription (Final Exercise Prescription Changes): Exercise Prescription Changes - 11/25/18 1100      Response to Exercise   Blood Pressure (Admit)  112/60    Blood Pressure (Exercise)  144/78    Blood Pressure (Exit)  98/60    Heart Rate (Admit)  98 bpm    Heart Rate (Exercise)  144 bpm    Heart Rate (Exit)  90 bpm    Rating of Perceived Exertion (Exercise)  12    Symptoms  none    Comments  Reviewed METs and goals     Duration  Continue with 30 min of aerobic exercise without signs/symptoms of physical distress.    Intensity  THRR New   60-135     Progression   Progression  Continue to progress workloads to maintain intensity without signs/symptoms of physical distress.    Average METs  4      Resistance Training   Training Prescription  Yes    Weight  4 lbs.     Reps  10-15    Time  10 Minutes      Interval Training   Interval Training  No      Treadmill   MPH  2.9    Grade  1    Minutes  15      Bike   Level  4    Minutes  15    METs  4.4      Home Exercise Plan   Plans to continue exercise at  Home (comment)    Frequency  Add 4 additional days to program exercise sessions.    Initial Home Exercises Provided  11/22/18       Nutrition:  Target Goals: Understanding of nutrition guidelines, daily intake of sodium <1555m, cholesterol <2078m calories 30% from fat and 7% or less from saturated fats, daily to have 5 or more servings  of fruits and vegetables.  Biometrics: Pre Biometrics - 11/07/18 1441      Pre Biometrics   Height  6' (1.829 m)    Weight  116.7 kg    Waist Circumference  37.5 inches    Hip Circumference  46 inches    Waist to Hip Ratio  0.82 %    BMI (Calculated)  34.89    Triceps Skinfold  34 mm    % Body Fat  31.9 %    Grip Strength  51 kg    Flexibility  0 in    Single Leg Stand  6.06 seconds        Nutrition Therapy Plan and Nutrition Goals:   Nutrition Assessments:   Nutrition Goals Re-Evaluation:   Nutrition Goals Discharge (Final Nutrition Goals Re-Evaluation):   Psychosocial: Target Goals: Acknowledge presence or absence of significant depression and/or stress, maximize coping skills, provide positive support system. Participant is able to verbalize types and ability to use techniques and skills needed for reducing stress and depression.  Initial Review & Psychosocial Screening: Initial Psych Review & Screening - 11/07/18 1510      Initial Review   Current issues with  None Identified      Family Dynamics  Good Support System?  Yes   Pt states that his wife and family are sources of support.     Barriers   Psychosocial barriers to participate in program  There are no identifiable barriers or psychosocial needs.      Screening Interventions   Interventions  Encouraged to exercise       Quality of Life Scores: Quality of Life - 11/07/18 1448      Quality of Life   Select  Quality of Life      Quality of Life Scores   Health/Function Pre  25.2 %    Socioeconomic Pre  30 %    Psych/Spiritual Pre  30 %    Family Pre  30 %    GLOBAL Pre  27.94 %      Scores of 19 and below usually indicate a poorer quality of life in these areas.  A difference of  2-3 points is a clinically meaningful difference.  A difference of 2-3 points in the total score of the Quality of Life Index has been associated with significant improvement in overall quality of life, self-image,  physical symptoms, and general health in studies assessing change in quality of life.  PHQ-9: Recent Review Flowsheet Data    Depression screen [facility] 2/9 11/07/2018   Decreased Interest 0   Down, Depressed, Hopeless 0   PHQ - 2 Score 0     Interpretation of Total Score  Total Score Depression Severity:  1-4 = Minimal depression, 5-9 = Mild depression, 10-14 = Moderate depression, 15-19 = Moderately severe depression, 20-27 = Severe depression   Psychosocial Evaluation and Intervention: Psychosocial Evaluation - 11/11/18 1026      Psychosocial Evaluation & Interventions   Interventions  Encouraged to exercise with the program and follow exercise prescription    Comments  No psychosocial needs identified.  Marcus Parsons enjoys playing with his grandchildren.    Expected Outcomes  Marcus Parsons will maintain a positive outlook with good coping skills.    Continue Psychosocial Services   No Follow up required       Psychosocial Re-Evaluation: Psychosocial Re-Evaluation    [place] Name 11/22/18 [phone]             Psychosocial Re-Evaluation   Current issues with  None Identified       Interventions  Encouraged to attend Cardiac Rehabilitation for the exercise       Continue Psychosocial Services   No Follow up required          Psychosocial Discharge (Final Psychosocial Re-Evaluation): Psychosocial Re-Evaluation - 11/22/18 0851      Psychosocial Re-Evaluation   Current issues with  None Identified    Interventions  Encouraged to attend Cardiac Rehabilitation for the exercise    Continue Psychosocial Services   No Follow up required       Vocational Rehabilitation: Provide vocational rehab assistance to qualifying candidates.   Vocational Rehab Evaluation & Intervention: Vocational Rehab - 11/07/18 1510      Initial Vocational Rehab Evaluation & Intervention   Assessment shows need for Vocational Rehabilitation  No       Education: Education Goals: Education classes will be provided on a  weekly basis, covering required topics. Participant will state understanding/return demonstration of topics presented.  Learning Barriers/Preferences: Learning Barriers/Preferences - 11/07/18 1442      Learning Barriers/Preferences   Learning Barriers  None    Learning Preferences  Audio;Computer/Internet;Group Instruction;Individual Instruction;Skilled Demonstration;Pictoral;Verbal Instruction;Video;Written Material       Education Topics:  Hypertension, Hypertension Reduction -Define heart disease and high blood pressure. Discus how high blood pressure affects the body and ways to reduce high blood pressure.   Exercise and Your Heart -Discuss why it is important to exercise, the FITT principles of exercise, normal and abnormal responses to exercise, and how to exercise safely.   Angina -Discuss definition of angina, causes of angina, treatment of angina, and how to decrease risk of having angina.   Cardiac Medications -Review what the following cardiac medications are used for, how they affect the body, and side effects that may occur when taking the medications.  Medications include Aspirin, Beta blockers, calcium channel blockers, ACE Inhibitors, angiotensin receptor blockers, diuretics, digoxin, and antihyperlipidemics.   Congestive Heart Failure -Discuss the definition of CHF, how to live with CHF, the signs and symptoms of CHF, and how keep track of weight and sodium intake.   Heart Disease and Intimacy -Discus the effect sexual activity has on the heart, how changes occur during intimacy as we age, and safety during sexual activity.   Smoking Cessation / COPD -Discuss different methods to quit smoking, the health benefits of quitting smoking, and the definition of COPD.   Nutrition I: Fats -Discuss the types of cholesterol, what cholesterol does to the heart, and how cholesterol levels can be controlled.   Nutrition II: Labels -Discuss the different components of  food labels and how to read food label   Heart Parts/Heart Disease and PAD -Discuss the anatomy of the heart, the pathway of blood circulation through the heart, and these are affected by heart disease.   Stress I: Signs and Symptoms -Discuss the causes of stress, how stress may lead to anxiety and depression, and ways to limit stress.   Stress II: Relaxation -Discuss different types of relaxation techniques to limit stress.   Warning Signs of Stroke / TIA -Discuss definition of a stroke, what the signs and symptoms are of a stroke, and how to identify when someone is having stroke.   Knowledge Questionnaire Score: Knowledge Questionnaire Score - 11/07/18 1442      Knowledge Questionnaire Score   Pre Score  23/24       Core Components/Risk Factors/Patient Goals at Admission: Personal Goals and Risk Factors at Admission - 11/07/18 1442      Core Components/Risk Factors/Patient Goals on Admission    Weight Management  Yes;Obesity;Weight Maintenance;Weight Loss    Intervention  Weight Management: Develop a combined nutrition and exercise program designed to reach desired caloric intake, while maintaining appropriate intake of nutrient and fiber, sodium and fats, and appropriate energy expenditure required for the weight goal.;Weight Management: Provide education and appropriate resources to help participant work on and attain dietary goals.;Weight Management/Obesity: Establish reasonable short term and long term weight goals.;Obesity: Provide education and appropriate resources to help participant work on and attain dietary goals.    Admit Weight  257 lb 4.4 oz (116.7 kg)    Expected Outcomes  Short Term: Continue to assess and modify interventions until short term weight is achieved;Long Term: Adherence to nutrition and physical activity/exercise program aimed toward attainment of established weight goal;Weight Maintenance: Understanding of the daily nutrition guidelines, which  includes 25-35% calories from fat, 7% or less cal from saturated fats, less than 272m cholesterol, less than 1.5gm of sodium, & 5 or more servings of fruits and vegetables daily;Weight Loss: Understanding of general recommendations for a balanced deficit meal plan, which promotes 1-2 lb weight loss per week and includes a negative energy balance  of [phone] kcal/d;Understanding recommendations for meals to include 15-35% energy as protein, 25-35% energy from fat, 35-60% energy from carbohydrates, less than 273m of dietary cholesterol, 20-35 gm of total fiber daily;Understanding of distribution of calorie intake throughout the day with the consumption of 4-5 meals/snacks    Hypertension  Yes    Intervention  Monitor prescription use compliance.;Provide education on lifestyle modifcations including regular physical activity/exercise, weight management, moderate sodium restriction and increased consumption of fresh fruit, vegetables, and low fat dairy, alcohol moderation, and smoking cessation.    Expected Outcomes  Short Term: Continued assessment and intervention until BP is < 140/944mHG in hypertensive participants. < 130/8064mG in hypertensive participants with diabetes, heart failure or chronic kidney disease.;Long Term: Maintenance of blood pressure at goal levels.    Lipids  Yes    Intervention  Provide education and support for participant on nutrition & aerobic/resistive exercise along with prescribed medications to achieve LDL <32m46mDL >40mg67m Expected Outcomes  Short Term: Participant states understanding of desired cholesterol values and is compliant with medications prescribed. Participant is following exercise prescription and nutrition guidelines.;Long Term: Cholesterol controlled with medications as prescribed, with individualized exercise RX and with personalized nutrition plan. Value goals: LDL < 32mg,23m > 40 mg.       Core Components/Risk Factors/Patient Goals Review:  Goals and  Risk Factor Review    Row Name 11/11/18 1028 11/22/18 0851           Core Components/Risk Factors/Patient Goals Review   Personal Goals Review  Weight Management/Obesity;Hypertension;Lipids  Weight Management/Obesity;Hypertension;Lipids      Review  Pt with CAD RFs willing to participate in CR exercise.  [place] wouuld like to lose weight and decrease his medications.  Pt with CAD RFs willing to participate in CR exercise.  Marcus Parsons wouuld like to lose weight and decrease his medications. Marcus Parsons doing well with exercise. Will continue to monitor BP      Expected Outcomes  Pt will continue to participate in CR exercise, nutrition, and lifestyle modification opportunities.  Pt will continue to participate in CR exercise, nutrition, and lifestyle modification opportunities.         Core Components/Risk Factors/Patient Goals at Discharge (Final Review):  Goals and Risk Factor Review - 11/22/18 0851      Core Components/Risk Factors/Patient Goals Review   Personal Goals Review  Weight Management/Obesity;Hypertension;Lipids    Review  Pt with CAD RFs willing to participate in CR exercise.  Marcus Parsons wouuld like to lose weight and decrease his medications. Marcus Parsons doing well with exercise. Will continue to monitor BP    Expected Outcomes  Pt will continue to participate in CR exercise, nutrition, and lifestyle modification opportunities.       ITP Comments: ITP Comments    Row Name 11/07/18 1509 11/11/18 1026 11/22/18 0853       ITP Comments  Dr. Traci Fransico Himcal Director  Pt started exercise today and tolerated it well.  30 Day ITP Review. Patient with good participation and attendance in phase 2 cardiac rehab.        Comments: See ITP comments.Marcus Parsons Barnet PallSN 11/28/2018 11:49 AM

## 2018-11-27 ENCOUNTER — Encounter (HOSPITAL_COMMUNITY)
Admission: RE | Admit: 2018-11-27 | Discharge: 2018-11-27 | Disposition: A | Discharge status: Home / Self Care | Payer: Medicare Other | Source: Ambulatory Visit | Attending: Cardiovascular Disease | Admitting: Cardiovascular Disease

## 2018-11-27 ENCOUNTER — Other Ambulatory Visit: Payer: Self-pay

## 2018-11-27 DIAGNOSIS — I493 Ventricular premature depolarization: Secondary | ICD-10-CM | POA: Diagnosis not present

## 2018-11-27 DIAGNOSIS — I214 Non-ST elevation (NSTEMI) myocardial infarction: Secondary | ICD-10-CM

## 2018-11-27 DIAGNOSIS — R9431 Abnormal electrocardiogram [ECG] [EKG]: Secondary | ICD-10-CM | POA: Diagnosis not present

## 2018-11-28 ENCOUNTER — Other Ambulatory Visit: Payer: Self-pay | Admitting: Cardiovascular Disease

## 2018-11-28 ENCOUNTER — Telehealth: Payer: Self-pay

## 2018-11-28 DIAGNOSIS — I493 Ventricular premature depolarization: Secondary | ICD-10-CM

## 2018-11-28 DIAGNOSIS — R9431 Abnormal electrocardiogram [ECG] [EKG]: Secondary | ICD-10-CM

## 2018-11-28 NOTE — Telephone Encounter (Signed)
-----   Message from Josue Hector, MD sent at 11/28/2018 12:27 PM EDT ----- High burden PVC;s 24.6 % continue beta blocker f/u EP ? Amiodarone

## 2018-11-28 NOTE — Telephone Encounter (Signed)
Patient aware of results. Per Dr. Johnsie Cancel, High burden PVC;s 24.6 % continue beta blocker f/u EP ? Amiodarone. Patient verbalized understanding.   Patient has appt with Dr. Curt Bears on Monday.

## 2018-11-29 ENCOUNTER — Ambulatory Visit: Payer: Medicare Other | Admitting: Cardiology

## 2018-11-29 ENCOUNTER — Other Ambulatory Visit: Payer: Self-pay

## 2018-11-29 ENCOUNTER — Encounter (HOSPITAL_COMMUNITY)
Admission: RE | Admit: 2018-11-29 | Discharge: 2018-11-29 | Disposition: A | Discharge status: Home / Self Care | Payer: Medicare Other | Source: Ambulatory Visit | Attending: Cardiovascular Disease | Admitting: Cardiovascular Disease

## 2018-11-29 DIAGNOSIS — I214 Non-ST elevation (NSTEMI) myocardial infarction: Secondary | ICD-10-CM | POA: Diagnosis not present

## 2018-12-02 ENCOUNTER — Other Ambulatory Visit: Payer: Self-pay

## 2018-12-02 ENCOUNTER — Encounter: Payer: Self-pay | Admitting: Cardiology

## 2018-12-02 ENCOUNTER — Ambulatory Visit (INDEPENDENT_AMBULATORY_CARE_PROVIDER_SITE_OTHER): Payer: Medicare Other | Admitting: Cardiology

## 2018-12-02 ENCOUNTER — Encounter (HOSPITAL_COMMUNITY)
Admission: RE | Admit: 2018-12-02 | Discharge: 2018-12-02 | Disposition: A | Discharge status: Home / Self Care | Payer: Medicare Other | Source: Ambulatory Visit | Attending: Cardiovascular Disease | Admitting: Cardiovascular Disease

## 2018-12-02 VITALS — BP 128/66 | HR 74 | Ht 72.0 in | Wt 252.8 lb

## 2018-12-02 DIAGNOSIS — I493 Ventricular premature depolarization: Secondary | ICD-10-CM

## 2018-12-02 DIAGNOSIS — I214 Non-ST elevation (NSTEMI) myocardial infarction: Secondary | ICD-10-CM | POA: Diagnosis not present

## 2018-12-02 MED ORDER — MEXILETINE HCL 150 MG PO CAPS: 150.0000 mg | ORAL_CAPSULE | Freq: Two times a day (BID) | ORAL | 3 refills | Status: DC

## 2018-12-02 NOTE — Patient Instructions (Addendum)
Medication Instructions:  Your physician has recommended you make the following change in your medication:  1. START Mexiletine 150 mg twice a day  * If you need a refill on your cardiac medications before your next appointment, please call your pharmacy.   Labwork: None ordered  Testing/Procedures: None ordered  Follow-Up: You are scheduled for a follow up nurse visit EKG on 12/16/2018 @ 11:00 am  You are scheduled for follow up with Dr. Curt Bears on 03/10/2019 @ 10:45 am  Thank you for choosing [facility]!!   Trinidad Curet, RN [phone]  Any Other Special Instructions Will Be Listed Below (If Applicable).  Mexiletine capsules What is this medicine? MEXILETINE (mex [place] e teen) is an antiarrhythmic agent. This medicine is used to treat irregular heart rhythm and can slow rapid heartbeats. It can help your heart to return to and maintain a normal rhythm. Because of the side effects caused by this medicine, it is usually used for heartbeat problems that may be life-threatening. This medicine may be used for other purposes; ask your health care provider or pharmacist if you have questions. COMMON BRAND NAME(S): Mexitil What should I tell my health care provider before I take this medicine? They need to know if you have any of these conditions:  liver disease  other heart problems  previous heart attack  an unusual or allergic reaction to mexiletine, other medicines, foods, dyes, or preservatives  pregnant or trying to get pregnant  breast-feeding How should I use this medicine? Take this medicine by mouth with a glass of water. Follow the directions on the prescription label. It is recommended that you take this medicine with food or an antacid. Take your doses at regular intervals. Do not take your medicine more often than directed. Do not stop taking except on the advice of your doctor or health care professional. Talk to your pediatrician regarding the use of this  medicine in children. Special care may be needed. Overdosage: If you think you have taken too much of this medicine contact a poison control center or emergency room at once. NOTE: This medicine is only for you. Do not share this medicine with others. What if I miss a dose? If you miss a dose, take it as soon as you can. If it is almost time for your next dose, take only that dose. Do not take double or extra doses. What may interact with this medicine? Do not take this medicine with any of the following medications:  dofetilide This medicine may also interact with the following medications:  caffeine  cimetidine  medicines for depression, anxiety, or psychotic disturbances  medicines to control heart rhythm  phenobarbital  phenytoin  rifampin  theophylline This list may not describe all possible interactions. Give your health care provider a list of all the medicines, herbs, non-prescription drugs, or dietary supplements you use. Also tell them if you smoke, drink alcohol, or use illegal drugs. Some items may interact with your medicine. What should I watch for while using this medicine? Your condition will be monitored closely when you first begin therapy. Often, this drug is first started in a hospital or other monitored health care setting. Once you are on maintenance therapy, visit your doctor or health care provider for regular checks on your progress. Because your condition and use of this medicine carry some risk, it is a good idea to carry an identification card, necklace or bracelet with details of your condition, medications, and doctor or health care  provider. You may get drowsy or dizzy. Do not drive, use machinery, or do anything that needs mental alertness until you know how this medicine affects you. Do not stand or sit up quickly, especially if you are an older patient. This reduces the risk of dizzy or fainting spells. Alcohol can make you more dizzy, increase flushing  and rapid heartbeats. Avoid alcoholic drinks. This medicine may cause serious skin reactions. They can happen weeks to months after starting the medicine. Contact your health care provider right away if you notice fevers or flu-like symptoms with a rash. The rash may be red or purple and then turn into blisters or peeling of the skin. Or, you might notice a red rash with swelling of the face, lips or lymph nodes in your neck or under your arms. What side effects may I notice from receiving this medicine? Side effects that you should report to your doctor or health care professional as soon as possible:  allergic reactions like skin rash, itching or hives, swelling of the face, lips, or tongue  breathing problems  chest pain, continued irregular heartbeats  rash, fever, and swollen lymph nodes  redness, blistering, peeling or loosening of the skin, including inside the mouth  seizures  skin rash  trembling, shaking  unusual bleeding or bruising  unusually weak or tired Side effects that usually do not require medical attention (report to your doctor or health care professional if they continue or are bothersome):  blurred vision  difficulty walking  heartburn  nausea, vomiting  nervousness  numbness, or tingling in the fingers or toes This list may not describe all possible side effects. Call your doctor for medical advice about side effects. You may report side effects to FDA at 1-800-FDA-1088. Where should I keep my medicine? Keep out of reach of children. Store at room temperature between 15 and 30 degrees C (59 and 86 degrees F). Throw away any unused medicine after the expiration date. NOTE: This sheet is a summary. It may not cover all possible information. If you have questions about this medicine, talk to your doctor, pharmacist, or health care provider.  2020 Elsevier/Gold Standard (2018-05-08 09:25:42)       Premature Ventricular Contraction  A premature  ventricular contraction (PVC) is a common kind of irregular heartbeat (arrhythmia). These contractions are extra heartbeats that start in the ventricles of the heart and occur too early in the normal sequence. During the PVC, the heart's normal electrical pathway is not used, so the beat is shorter and less effective. In most cases, these contractions come and go and do not require treatment. What are the causes? Common causes of the condition include:  Smoking.  Drinking alcohol.  Certain medicines.  Some illegal drugs.  Stress.  Caffeine. Certain medical conditions can also cause PVCs:  Heart failure.  Heart attack, or coronary artery disease.  Heart valve problems.  Changes in minerals in the blood (electrolytes).  Low blood oxygen levels or high carbon dioxide levels. In many cases, the cause of this condition is not known. What are the signs or symptoms? The main symptom of this condition is fast or skipped heartbeats (palpitations). Other symptoms include:  Chest pain.  Shortness of breath.  Feeling tired.  Dizziness.  Difficulty exercising. In some cases, there are no symptoms. How is this diagnosed? This condition may be diagnosed based on:  Your medical history.  A physical exam. During the exam, the health care provider will check for irregular heartbeats.  Tests, such as: ? An ECG (electrocardiogram) to monitor the electrical activity of your heart. ? An ambulatory cardiac monitor. This device records your heartbeats for 24 hours or more. ? Stress tests to see how exercise affects your heart rhythm and blood supply. ? An echocardiogram. This test uses sound waves (ultrasound) to produce an image of your heart. ? An electrophysiology study (EPS). This test checks for electrical problems in your heart. How is this treated? Treatment for this condition depends on any underlying conditions, the type of PVCs that you are having, and how much the symptoms  are interfering with your daily life. Possible treatments include:  Avoiding things that cause premature contractions (triggers). These include caffeine and alcohol.  Taking medicines if symptoms are severe or if the extra heartbeats are frequent.  Getting treatment for underlying conditions that cause PVCs.  Having an implantable cardioverter defibrillator (ICD), if you are at risk for a serious arrhythmia. The ICD is a small device that is inserted into your chest to monitor your heartbeat. When it senses an irregular heartbeat, it sends a shock to bring the heartbeat back to normal.  Having a procedure to destroy the portion of the heart tissue that sends out abnormal signals (catheter ablation). In some cases, no treatment is required. Follow these instructions at home: Lifestyle  Do not use any products that contain nicotine or tobacco, such as cigarettes, e-cigarettes, and chewing tobacco. If you need help quitting, ask your health care provider.  Do not use illegal drugs.  Exercise regularly. Ask your health care provider what type of exercise is safe for you.  Try to get at least 7-9 hours of sleep each night, or as much as recommended by your health care provider.  Find healthy ways to manage stress. Avoid stressful situations when possible. Alcohol use  Do not drink alcohol if: ? Your health care provider tells you not to drink. ? You are pregnant, may be pregnant, or are planning to become pregnant. ? Alcohol triggers your episodes.  If you drink alcohol: ? Limit how much you use to:  0-1 drink a day for women.  0-2 drinks a day for men.  Be aware of how much alcohol is in your drink. In the [place] one drink equals one 12 oz bottle of beer (355 mL), one 5 oz glass of wine (148 mL), or one 1 oz glass of hard liquor (44 mL). General instructions  Take over-the-counter and prescription medicines only as told by your health care provider.  If caffeine triggers  episodes of PVC, do not eat, drink, or use anything with caffeine in it.  Keep all follow-up visits as told by your health care provider. This is important. Contact a health care provider if you:  Feel palpitations. Get help right away if you:  Have chest pain.  Have shortness of breath.  Have sweating for no reason.  Have nausea and vomiting.  Become light-headed or you faint. Summary  A premature ventricular contraction (PVC) is a common kind of irregular heartbeat (arrhythmia).  In most cases, these contractions come and go and do not require treatment.  You may need to wear an ambulatory cardiac monitor. This records your heartbeats for 24 hours or more.  Treatment depends on any underlying conditions, the type of PVCs that you are having, and how much the symptoms are interfering with your daily life. This information is not intended to replace advice given to you by your health care provider. Make sure  you discuss any questions you have with your health care provider. Document Released: 09/17/2003 Document Revised: 10/25/2017 Document Reviewed: 10/25/2017 Elsevier Patient Education  2020 [employer].

## 2018-12-02 NOTE — Progress Notes (Signed)
Electrophysiology Office Note   Date:  12/02/2018   ID:  Marcus, Parsons 1945-10-09, MRN [id number]  PCP:  Lujean Amel, MD  Cardiologist: Johnsie Cancel Primary Electrophysiologist:  Nikkol Pai Meredith Leeds, MD    Chief Complaint: PVC   History of Present Illness: Marcus Parsons is a 73 y.o. male who is being seen today for the evaluation of PVC at the request of [place]. Presenting today for electrophysiology evaluation.  He has a history of coronary artery disease status post STEMI in 2015 status post drug-eluting stents to the RCA, hypertension, OSA.  He wore a cardiac monitor that showed approximately 25% PVCs.  Today, he denies symptoms of palpitations, chest pain, shortness of breath, orthopnea, PND, lower extremity edema, claudication, presyncope, syncope, bleeding, or neurologic sequela. The patient is tolerating medications without difficulties.  He does have occasional episodes of dizziness, but he does not endorse fatigue, weakness, or shortness of breath.  He continues to work at cardiac rehab.   Past Medical History:  Diagnosis Date   Acute myocardial infarction ([place]) 04/2013   INFERIOR   Atherosclerosis of coronary artery    Benign prostatic hypertrophy    Carotid stenosis    Hyperlipidemia    Obstructive sleep apnea    S/P coronary artery stent placement    Past Surgical History:  Procedure Laterality Date   HERNIA REPAIR     KNEE SURGERY     AS CHILD   LEFT HEART CATH AND CORONARY ANGIOGRAPHY N/A 04/24/2018   Procedure: LEFT HEART CATH AND CORONARY ANGIOGRAPHY;  Surgeon: Martinique, Peter M, MD;  Location: [facility];  Service: Cardiovascular;  Laterality: N/A;     Current Outpatient Medications  Medication Sig Dispense Refill   aspirin EC 81 MG tablet Take 81 mg by mouth daily.     atorvastatin (LIPITOR) 80 MG tablet Take 1 tablet (80 mg total) by mouth at bedtime. 90 tablet 3   cholecalciferol (VITAMIN D3) 25 MCG (1000 UT) tablet Take  1,000 Units by mouth daily.     dutasteride (AVODART) 0.5 MG capsule Take 1 capsule (0.5 mg total) by mouth daily. 90 capsule 3   isosorbide mononitrate (IMDUR) 30 MG 24 hr tablet Take 0.5 tablets (15 mg total) by mouth daily. 45 tablet 3   metoprolol succinate (TOPROL XL) 25 MG 24 hr tablet Take 0.5 tablets (12.5 mg total) by mouth daily. 45 tablet 3   nitroGLYCERIN (NITROSTAT) 0.4 MG SL tablet Place 1 tablet (0.4 mg total) under the tongue every 5 (five) minutes as needed for chest pain. 25 tablet 5   olmesartan (BENICAR) 40 MG tablet Take 0.5 tablets (20 mg total) by mouth daily. 45 tablet 2   prasugrel (EFFIENT) 10 MG TABS tablet Take 1 tablet (10 mg total) by mouth daily. 90 tablet 3   PRESCRIPTION MEDICATION CPAP: At bedtime     Probiotic Product (PROBIOTIC DAILY) CAPS Take 1 capsule by mouth daily. 90 capsule 3   tamsulosin (FLOMAX) 0.4 MG CAPS capsule Take 0.4 mg by mouth daily.     mexiletine (MEXITIL) 150 MG capsule Take 1 capsule (150 mg total) by mouth 2 (two) times daily. 60 capsule 3   No current facility-administered medications for this visit.     Allergies:   Plavix [clopidogrel bisulfate]   Social History:  The patient  reports that he has never smoked. He has never used smokeless tobacco. He reports that he does not drink alcohol or use drugs.   Family History:  The  patient's family history includes CAD in his paternal uncle; Dementia in his mother; Diabetes Mellitus I in his father; Heart disease in his paternal uncle; Hypertension in his father; Leukemia in his maternal uncle; Lung cancer in his father.    ROS:  Please see the history of present illness.   Otherwise, review of systems is positive for none.   All other systems are reviewed and negative.    PHYSICAL EXAM: VS:  BP 128/66    Pulse 74    Ht 6' (1.829 m)    Wt 252 lb 12.8 oz (114.7 kg)    SpO2 97%    BMI 34.29 kg/m  , BMI Body mass index is 34.29 kg/m. GEN: Well nourished, well developed, in no  acute distress  HEENT: normal  Neck: no JVD, carotid bruits, or masses Cardiac: RRR; no murmurs, rubs, or gallops,no edema  Respiratory:  clear to auscultation bilaterally, normal work of breathing GI: soft, nontender, nondistended, + BS MS: no deformity or atrophy  Skin: warm and dry Neuro:  Strength and sensation are intact Psych: euthymic mood, full affect  EKG:  EKG is ordered today. Personal review of the ekg ordered shows rhythm, PVCs, possible inferior infarct  Recent Labs: 04/24/2018: B Natriuretic Peptide 42.5 04/25/2018: Hemoglobin 12.8; Platelets 193 07/19/2018: ALT 27 11/18/2018: BUN 14; Creatinine, Ser 1.08; Magnesium 1.8; Potassium 4.1; Sodium 140    Lipid Panel     Component Value Date/Time   CHOL 105 07/19/2018 0833   TRIG 155 (H) 07/19/2018 0833   HDL 33 (L) 07/19/2018 0833   CHOLHDL 3.2 07/19/2018 0833   CHOLHDL 3.7 04/24/2018 0630   VLDL 34 04/24/2018 0630   LDLCALC 41 07/19/2018 0833     Wt Readings from Last 3 Encounters:  12/02/18 252 lb 12.8 oz (114.7 kg)  11/07/18 257 lb 4.4 oz (116.7 kg)  07/10/18 256 lb (116.1 kg)      Other studies Reviewed: Additional studies/ records that were reviewed today include: [place] 04/24/18  Review of the above records today demonstrates:   Previously placed Mid RCA stent (unknown type) is widely patent.  Ost 1st Diag to 1st Diag lesion is 100% stenosed.  LV end diastolic pressure is normal.   1. Single vessel occlusive CAD involving a small first diagonal. The stent in the mid RCA is widely patent. Diffuse coronary ectasia 2. Normal LVEDP  TTE 04/24/18  1. Mild hypokinesis of the left ventricular, mid-apical inferoseptal wall, inferior wall, inferolateral wall and lateral wall.  2. The left ventricle has mildly reduced systolic function, with an ejection fraction of 45-50%. The cavity size was normal. There is moderate concentric left ventricular hypertrophy. Left ventricular diastolic Doppler parameters are  consistent with  impaired relaxation.  3. The right ventricle has normal systolic function. The cavity was normal. There is no increase in right ventricular wall thickness.  4. Left atrial size was moderately dilated.  5. Mild thickening of the mitral valve leaflet. Mild calcification of the mitral valve leaflet.  6. The aortic valve is tricuspid Mild thickening of the aortic valve Mild calcification of the aortic valve. Aortic valve regurgitation is trivial by color flow Doppler.  Cardiac monitor 11/28/18 NSR average HR 71 PAC;s/Atrial arrhythmias <1% PVC;s frequent minimal NSVT  PVC;s high burden 24.6% of beats   ASSESSMENT AND PLAN:  1.  PVCs: High burden of PVCs at almost 25%.  PVCs appear to be coming from the posterior LV.  As he is minimally symptomatic, we Chestina Komatsu plan to  start with medication management.  We Maryori Weide start him on mexiletine 150 mg twice a day.  We Falon Flinchum have him come back in 2 weeks for an ECG.  2.  Coronary artery disease: Status post RCA stent.  No current chest pain.  3.  Hypertension: Currently well controlled  4.  Hyperlipidemia: Lipitor per primary cardiology  5.  Chronic systolic heart failure due to ischemic cardiomyopathy: Ejection fraction 45 to 50%.  Has had an MI in the past.  Currently on an ARB per primary cardiology.  Current medicines are reviewed at length with the patient today.   The patient does not have concerns regarding his medicines.  The following changes were made today: Start mexiletine  Labs/ tests ordered today include:  Orders Placed This Encounter  Procedures   EKG 12-Lead     Disposition:   FU with Reymundo Winship 3 months  Signed, Denisa Enterline Meredith Leeds, MD  12/02/2018 1:44 PM     [address] [place] [address] [phone] (office) [phone] (fax)

## 2018-12-03 ENCOUNTER — Other Ambulatory Visit: Payer: Self-pay | Admitting: Cardiology

## 2018-12-03 MED ORDER — MEXILETINE HCL 150 MG PO CAPS: 150.0000 mg | ORAL_CAPSULE | Freq: Two times a day (BID) | ORAL | 3 refills | Status: DC

## 2018-12-03 NOTE — Telephone Encounter (Signed)
  Patient called to request that we send the prescription for mexiletine (MEXITIL) 150 MG capsule that was called in yesterday to [facility] on [address]. We sent it to his usual pharmacy at [facility] but they do not have the medication in stock at this time.

## 2018-12-03 NOTE — Telephone Encounter (Signed)
Pt's medication was sent to pt's pharmacy as requested. Confirmation received.  °

## 2018-12-04 ENCOUNTER — Other Ambulatory Visit: Payer: Self-pay

## 2018-12-04 ENCOUNTER — Encounter (HOSPITAL_COMMUNITY)
Admission: RE | Admit: 2018-12-04 | Discharge: 2018-12-04 | Disposition: A | Discharge status: Home / Self Care | Payer: Medicare Other | Source: Ambulatory Visit | Attending: Cardiovascular Disease | Admitting: Cardiovascular Disease

## 2018-12-04 DIAGNOSIS — I214 Non-ST elevation (NSTEMI) myocardial infarction: Secondary | ICD-10-CM

## 2018-12-06 ENCOUNTER — Other Ambulatory Visit: Payer: Self-pay

## 2018-12-06 ENCOUNTER — Encounter (HOSPITAL_COMMUNITY)
Admission: RE | Admit: 2018-12-06 | Discharge: 2018-12-06 | Disposition: A | Discharge status: Home / Self Care | Payer: Medicare Other | Source: Ambulatory Visit | Attending: Cardiovascular Disease | Admitting: Cardiovascular Disease

## 2018-12-06 DIAGNOSIS — I214 Non-ST elevation (NSTEMI) myocardial infarction: Secondary | ICD-10-CM | POA: Diagnosis not present

## 2018-12-09 ENCOUNTER — Other Ambulatory Visit: Payer: Self-pay

## 2018-12-09 ENCOUNTER — Encounter (HOSPITAL_COMMUNITY)
Admission: RE | Admit: 2018-12-09 | Discharge: 2018-12-09 | Disposition: A | Discharge status: Home / Self Care | Payer: Medicare Other | Source: Ambulatory Visit | Attending: Cardiovascular Disease | Admitting: Cardiovascular Disease

## 2018-12-09 DIAGNOSIS — I214 Non-ST elevation (NSTEMI) myocardial infarction: Secondary | ICD-10-CM | POA: Diagnosis not present

## 2018-12-11 ENCOUNTER — Other Ambulatory Visit: Payer: Self-pay

## 2018-12-11 ENCOUNTER — Encounter (HOSPITAL_COMMUNITY)
Admission: RE | Admit: 2018-12-11 | Discharge: 2018-12-11 | Disposition: A | Discharge status: Home / Self Care | Payer: Medicare Other | Source: Ambulatory Visit | Attending: Cardiovascular Disease | Admitting: Cardiovascular Disease

## 2018-12-11 DIAGNOSIS — I214 Non-ST elevation (NSTEMI) myocardial infarction: Secondary | ICD-10-CM

## 2018-12-11 NOTE — Progress Notes (Signed)
Marcus Parsons 73 y.o. male Nutrition Note Spoke with pt. Nutrition Plan reviewed with pt. Pt is following a Heart Healthy diet. Discussed risk factors for heart disease specifically diet, exercise, lipid management, and blood pressure management. Encouraged adequate fiber intake and protein with every meal and snack. Provided educational handouts on heart healthy cooking tips, fiber rich carbs and blood sugar, as well as recipes  Pt has Pre-diabetes. Last A1c indicates blood glucose well-controlled. This Probation officer went over some diabetes education.    Pt expressed understanding of the information reviewed.   Lab Results  Component Value Date   HGBA1C 6.0 (H) 04/24/2018    Wt Readings from Last 3 Encounters:  12/02/18 252 lb 12.8 oz (114.7 kg)  11/07/18 257 lb 4.4 oz (116.7 kg)  07/10/18 256 lb (116.1 kg)    Nutrition Diagnosis ? Food-and nutrition-related knowledge deficit related to lack of exposure to information as related to diagnosis of: ? CVD ? Pre-diabetes ? Obese  I = 30-34.9 related to excessive energy intake as evidenced by a 34.2  Nutrition Intervention ? Pt's individual nutrition plan reviewed with pt. ? Benefits of adopting Heart Healthy diet   ? Pt given handouts for fiber rich carbs, recipes, and heart healthy cooking tips ? Continue client-centered nutrition education by RD, as part of interdisciplinary care.  Goal(s) ? Pt to identify and limit food sources of saturated fat, trans fat, refined carbohydrates and sodium ? Pt to build a healthy plate including vegetables, fruits, whole grains, and low-fat dairy products in a heart healthy meal plan. ? Pt able to name foods that affect blood glucose  Plan:   Will provide client-centered nutrition education as part of interdisciplinary care  Monitor and evaluate progress toward nutrition goal with team.   Michaele Offer, MS, RDN, LDN

## 2018-12-13 ENCOUNTER — Encounter (HOSPITAL_COMMUNITY)
Admission: RE | Admit: 2018-12-13 | Discharge: 2018-12-13 | Disposition: A | Discharge status: Home / Self Care | Payer: Medicare Other | Source: Ambulatory Visit | Attending: Cardiovascular Disease | Admitting: Cardiovascular Disease

## 2018-12-13 ENCOUNTER — Other Ambulatory Visit: Payer: Self-pay

## 2018-12-13 DIAGNOSIS — I214 Non-ST elevation (NSTEMI) myocardial infarction: Secondary | ICD-10-CM | POA: Diagnosis not present

## 2018-12-16 ENCOUNTER — Other Ambulatory Visit: Payer: Self-pay

## 2018-12-16 ENCOUNTER — Encounter (HOSPITAL_COMMUNITY)
Admission: RE | Admit: 2018-12-16 | Discharge: 2018-12-16 | Disposition: A | Discharge status: Home / Self Care | Payer: Medicare Other | Source: Ambulatory Visit | Attending: Cardiovascular Disease | Admitting: Cardiovascular Disease

## 2018-12-16 ENCOUNTER — Ambulatory Visit (INDEPENDENT_AMBULATORY_CARE_PROVIDER_SITE_OTHER): Payer: Medicare Other

## 2018-12-16 VITALS — BP 120/76 | HR 76 | Ht 72.0 in | Wt 252.0 lb

## 2018-12-16 DIAGNOSIS — I493 Ventricular premature depolarization: Secondary | ICD-10-CM | POA: Diagnosis not present

## 2018-12-16 DIAGNOSIS — I214 Non-ST elevation (NSTEMI) myocardial infarction: Secondary | ICD-10-CM | POA: Insufficient documentation

## 2018-12-16 DIAGNOSIS — Z79899 Other long term (current) drug therapy: Secondary | ICD-10-CM | POA: Diagnosis not present

## 2018-12-16 MED ORDER — MEXILETINE HCL 250 MG PO CAPS: 250.0000 mg | ORAL_CAPSULE | Freq: Two times a day (BID) | ORAL | 1 refills | Status: DC

## 2018-12-16 NOTE — Patient Instructions (Signed)
Medication Instructions:  Increase dose of Mexitil to 250 mg twice per day.  *If you need a refill on your cardiac medications before your next appointment, please call your pharmacy*  Lab Work: None ordered.   If you have labs (blood work) drawn today and your tests are completely normal, you will receive your results only by: Marland Kitchen MyChart Message (if you have MyChart) OR . A paper copy in the mail If you have any lab test that is abnormal or we need to change your treatment, we will call you to review the results.  Testing/Procedures: None ordered.  Follow-Up: At [facility], you and your health needs are our priority.  As part of our continuing mission to provide you with exceptional heart care, we have created designated Provider Care Teams.  These Care Teams include your primary Cardiologist (physician) and Advanced Practice Providers (APPs -  Physician Assistants and Nurse Practitioners) who all work together to provide you with the care you need, when you need it.  Your next appointment:   January  The format for your next appointment:   In Person  Provider:   Allegra Lai, MD

## 2018-12-16 NOTE — Progress Notes (Signed)
1.) Reason for visit: EKG  2.) Name of MD requesting visit: Dr. Curt Bears  3.) H&P: See Epic  4.) ROS related to problem: See Epic  5.) Assessment and plan per MD: Dr. Macky Lower increased dose of Mexitil to 250mg  twice per day due to PVC's.

## 2018-12-18 ENCOUNTER — Encounter (HOSPITAL_COMMUNITY)
Admission: RE | Admit: 2018-12-18 | Discharge: 2018-12-18 | Disposition: A | Discharge status: Home / Self Care | Payer: Medicare Other | Source: Ambulatory Visit | Attending: Cardiovascular Disease | Admitting: Cardiovascular Disease

## 2018-12-18 ENCOUNTER — Other Ambulatory Visit: Payer: Self-pay

## 2018-12-18 DIAGNOSIS — I214 Non-ST elevation (NSTEMI) myocardial infarction: Secondary | ICD-10-CM

## 2018-12-20 ENCOUNTER — Other Ambulatory Visit: Payer: Self-pay

## 2018-12-20 ENCOUNTER — Encounter (HOSPITAL_COMMUNITY)
Admission: RE | Admit: 2018-12-20 | Discharge: 2018-12-20 | Disposition: A | Discharge status: Home / Self Care | Payer: Medicare Other | Source: Ambulatory Visit | Attending: Cardiovascular Disease | Admitting: Cardiovascular Disease

## 2018-12-20 DIAGNOSIS — I214 Non-ST elevation (NSTEMI) myocardial infarction: Secondary | ICD-10-CM | POA: Diagnosis not present

## 2018-12-23 ENCOUNTER — Other Ambulatory Visit: Payer: Self-pay

## 2018-12-23 ENCOUNTER — Encounter (HOSPITAL_COMMUNITY)
Admission: RE | Admit: 2018-12-23 | Discharge: 2018-12-23 | Disposition: A | Discharge status: Home / Self Care | Payer: Medicare Other | Source: Ambulatory Visit | Attending: Cardiovascular Disease | Admitting: Cardiovascular Disease

## 2018-12-23 DIAGNOSIS — I214 Non-ST elevation (NSTEMI) myocardial infarction: Secondary | ICD-10-CM | POA: Diagnosis not present

## 2018-12-23 NOTE — Progress Notes (Signed)
Cardiac Individual Treatment Plan  Patient Details  Name: Marcus Parsons MRN: [id number] Date of Birth: [birth year] Referring Provider:     CARDIAC REHAB PHASE II ORIENTATION from 11/07/2018 in [place]  Referring Provider  Dr. Johnsie Cancel       Initial Encounter Date:    CARDIAC REHAB PHASE II ORIENTATION from 11/07/2018 in [place]  Date  11/07/18      Visit Diagnosis: NSTEMI (non-ST elevated myocardial infarction) [facility])  Patient's Home Medications on Admission:  Current Outpatient Medications:  .  aspirin EC 81 MG tablet, Take 81 mg by mouth daily., Disp: , Rfl:  .  atorvastatin (LIPITOR) 80 MG tablet, Take 1 tablet (80 mg total) by mouth at bedtime., Disp: 90 tablet, Rfl: 3 .  cholecalciferol (VITAMIN D3) 25 MCG (1000 UT) tablet, Take 1,000 Units by mouth daily., Disp: , Rfl:  .  dutasteride (AVODART) 0.5 MG capsule, Take 1 capsule (0.5 mg total) by mouth daily., Disp: 90 capsule, Rfl: 3 .  isosorbide mononitrate (IMDUR) 30 MG 24 hr tablet, Take 0.5 tablets (15 mg total) by mouth daily., Disp: 45 tablet, Rfl: 3 .  metoprolol succinate (TOPROL XL) 25 MG 24 hr tablet, Take 0.5 tablets (12.5 mg total) by mouth daily., Disp: 45 tablet, Rfl: 3 .  mexiletine (MEXITIL) 250 MG capsule, Take 1 capsule (250 mg total) by mouth 2 (two) times daily., Disp: 180 capsule, Rfl: 1 .  nitroGLYCERIN (NITROSTAT) 0.4 MG SL tablet, Place 1 tablet (0.4 mg total) under the tongue every 5 (five) minutes as needed for chest pain., Disp: 25 tablet, Rfl: 5 .  olmesartan (BENICAR) 40 MG tablet, TAKE ONE-HALF (1/2) TABLET DAILY, Disp: 45 tablet, Rfl: 3 .  prasugrel (EFFIENT) 10 MG TABS tablet, Take 1 tablet (10 mg total) by mouth daily., Disp: 90 tablet, Rfl: 3 .  PRESCRIPTION MEDICATION, CPAP: At bedtime, Disp: , Rfl:  .  Probiotic Product (PROBIOTIC DAILY) CAPS, Take 1 capsule by mouth daily., Disp: 90 capsule, Rfl: 3 .  tamsulosin (FLOMAX) 0.4 MG  CAPS capsule, Take 0.4 mg by mouth daily., Disp: , Rfl:   Past Medical History: Past Medical History:  Diagnosis Date  . Acute myocardial infarction ([place]) 04/2013   INFERIOR  . Atherosclerosis of coronary artery   . Benign prostatic hypertrophy   . Carotid stenosis   . Hyperlipidemia   . Obstructive sleep apnea   . S/P coronary artery stent placement     Tobacco Use: Social History   Tobacco Use  Smoking Status Never Smoker  Smokeless Tobacco Never Used    Labs: Recent Review Flowsheet Data    Labs for ITP Cardiac and Pulmonary Rehab Latest Ref Rng & Units 04/24/2018 07/19/2018   Cholestrol 100 - 199 mg/dL 125 105   LDLCALC 0 - 99 mg/dL 57 41   HDL >39 mg/dL 34(L) 33(L)   Trlycerides 0 - 149 mg/dL 168(H) 155(H)   Hemoglobin A1c 4.8 - 5.6 % 6.0(H) -      Capillary Blood Glucose: No results found for: GLUCAP   Exercise Target Goals: Exercise Program Goal: Individual exercise prescription set using results from initial 6 min walk test and THRR while considering  patient's activity barriers and safety.   Exercise Prescription Goal: Starting with aerobic activity 30 plus minutes a day, 3 days per week for initial exercise prescription. Provide home exercise prescription and guidelines that participant acknowledges understanding prior to discharge.  Activity Barriers & Risk Stratification: Activity Barriers &  Cardiac Risk Stratification - 11/07/18 1437      Activity Barriers & Cardiac Risk Stratification   Activity Barriers  None    Cardiac Risk Stratification  High       6 Minute Walk: 6 Minute Walk    Row Name 11/07/18 1436         6 Minute Walk   Phase  Initial     Distance  1657 feet     Walk Time  6 minutes     # of Rest Breaks  0     MPH  3.1     METS  3.2     RPE  9     Perceived Dyspnea   0     VO2 Peak  11.49     Symptoms  No     Resting HR  84 bpm     Resting BP  124/68     Resting Oxygen Saturation   97 %     Exercise Oxygen Saturation   during 6 min walk  96 %     Max Ex. HR  119 bpm     Max Ex. BP  130/70     2 Minute Post BP  120/70        Oxygen Initial Assessment:   Oxygen Re-Evaluation:   Oxygen Discharge (Final Oxygen Re-Evaluation):   Initial Exercise Prescription: Initial Exercise Prescription - 11/07/18 1400      Date of Initial Exercise RX and Referring Provider   Date  11/07/18    Referring Provider  Dr. Johnsie Cancel     Expected Discharge Date  01/03/19      Treadmill   MPH  2.7    Grade  1    Minutes  15      Bike   Level  2.5    Watts  45    Minutes  15    METs  3.23      Prescription Details   Frequency (times per week)  3    Duration  Progress to 30 minutes of continuous aerobic without signs/symptoms of physical distress      Intensity   THRR 40-80% of Max Heartrate  60-120    Ratings of Perceived Exertion  11-13      Progression   Progression  Continue to progress workloads to maintain intensity without signs/symptoms of physical distress.      Resistance Training   Training Prescription  Yes    Weight  4 lbs.     Reps  10-15       Perform Capillary Blood Glucose checks as needed.  Exercise Prescription Changes:  Exercise Prescription Changes    Row Name 11/11/18 1100 11/22/18 1000 11/25/18 1100 12/16/18 0845       Response to Exercise   Blood Pressure (Admit)  108/68  110/58  112/60  108/70    Blood Pressure (Exercise)  128/70  130/70  144/78  136/80    Blood Pressure (Exit)  98/50  106/62  98/60  108/64    Heart Rate (Admit)  88 bpm  99 bpm  98 bpm  78 bpm    Heart Rate (Exercise)  121 bpm  137 bpm  144 bpm  128 bpm    Heart Rate (Exit)  89 bpm  103 bpm  90 bpm  78 bpm    Rating of Perceived Exertion (Exercise)  _0 Symptoms  none  none  none  None    Comments  Pt first day of exercise.   -  Reviewed METs and goals   -    Duration  Continue with 30 min of aerobic exercise without signs/symptoms of physical distress.  Continue with 30 min of aerobic  exercise without signs/symptoms of physical distress.  Continue with 30 min of aerobic exercise without signs/symptoms of physical distress.  Continue with 30 min of aerobic exercise without signs/symptoms of physical distress.    Intensity  THRR unchanged  THRR unchanged  THRR New 60-135  THRR unchanged      Progression   Progression  Continue to progress workloads to maintain intensity without signs/symptoms of physical distress.  Continue to progress workloads to maintain intensity without signs/symptoms of physical distress.  Continue to progress workloads to maintain intensity without signs/symptoms of physical distress.  Continue to progress workloads to maintain intensity without signs/symptoms of physical distress.    Average METs  3._0 4.1      Resistance Training   Training Prescription  Yes  Yes  Yes  Yes    Weight  4 lbs.   4 lbs.   4 lbs.   5 lbs.     Reps  10-15  10-15  10-15  10-15    Time  10 Minutes  10 Minutes  10 Minutes  10 Minutes      Interval Training   Interval Training  No  No  No  No      Treadmill   MPH  2.9  2.9  2.9  3.2    Grade  _1 Minutes  _2 Bike   Level  2._3 4.5    Watts  45  -  -  -    Minutes  _4 METs  2.8  4.4  4.4  4.4      Home Exercise Plan   Plans to continue exercise at  -  Home (comment)  Home (comment)  Home (comment)    Frequency  -  Add 4 additional days to program exercise sessions.  Add 4 additional days to program exercise sessions.  Add 4 additional days to program exercise sessions.    Initial Home Exercises Provided  -  11/22/18  11/22/18  11/22/18       Exercise Comments:  Exercise Comments    Row Name 11/11/18 1126 11/22/18 1034 11/25/18 1202 12/26/18 7353     Exercise Comments  Pt first day of exercise in CR program. Pt tolerated exercise Rx well.  Reviewed HEP with Pt. Pt understands goals.  Reviewed METs and goals with Pt. Pt understands goals for exercsie at home as  well as MET level.  Reviewed METs and goals with Pt. Pt is progressing well.       Exercise Goals and Review:  Exercise Goals    Row Name 11/07/18 1441             Exercise Goals   Increase Physical Activity  Yes       Intervention  Provide advice, education, support and counseling about physical activity/exercise needs.;Develop an individualized exercise prescription for aerobic and resistive training based on initial evaluation findings, risk stratification, comorbidities and participant's personal goals.       Expected Outcomes  Short Term: Attend rehab  on a regular basis to increase amount of physical activity.;Long Term: Add in home exercise to make exercise part of routine and to increase amount of physical activity.;Long Term: Exercising regularly at least 3-5 days a week.       Increase Strength and Stamina  Yes       Intervention  Provide advice, education, support and counseling about physical activity/exercise needs.;Develop an individualized exercise prescription for aerobic and resistive training based on initial evaluation findings, risk stratification, comorbidities and participant's personal goals.       Expected Outcomes  Short Term: Increase workloads from initial exercise prescription for resistance, speed, and METs.;Short Term: Perform resistance training exercises routinely during rehab and add in resistance training at home;Long Term: Improve cardiorespiratory fitness, muscular endurance and strength as measured by increased METs and functional capacity (6MWT)       Able to understand and use rate of perceived exertion (RPE) scale  Yes       Intervention  Provide education and explanation on how to use RPE scale       Expected Outcomes  Short Term: Able to use RPE daily in rehab to express subjective intensity level;Long Term:  Able to use RPE to guide intensity level when exercising independently       Knowledge and understanding of Target Heart Rate Range (THRR)  Yes        Intervention  Provide education and explanation of THRR including how the numbers were predicted and where they are located for reference       Expected Outcomes  Short Term: Able to state/look up THRR;Long Term: Able to use THRR to govern intensity when exercising independently;Short Term: Able to use daily as guideline for intensity in rehab       Able to check pulse independently  Yes       Intervention  Provide education and demonstration on how to check pulse in carotid and radial arteries.;Review the importance of being able to check your own pulse for safety during independent exercise       Expected Outcomes  Short Term: Able to explain why pulse checking is important during independent exercise;Long Term: Able to check pulse independently and accurately       Understanding of Exercise Prescription  Yes       Intervention  Provide education, explanation, and written materials on patient's individual exercise prescription       Expected Outcomes  Short Term: Able to explain program exercise prescription;Long Term: Able to explain home exercise prescription to exercise independently          Exercise Goals Re-Evaluation : Exercise Goals Re-Evaluation    Row Name 11/11/18 1125 11/22/18 1032 11/25/18 1159 12/26/18 0821       Exercise Goal Re-Evaluation   Exercise Goals Review  Increase Physical Activity;Increase Strength and Stamina;Able to understand and use rate of perceived exertion (RPE) scale;Knowledge and understanding of Target Heart Rate Range (THRR);Understanding of Exercise Prescription  Increase Physical Activity;Increase Strength and Stamina;Able to understand and use rate of perceived exertion (RPE) scale;Knowledge and understanding of Target Heart Rate Range (THRR);Able to check pulse independently;Understanding of Exercise Prescription  Increase Physical Activity;Understanding of Exercise Prescription;Increase Strength and Stamina;Knowledge and understanding of Target Heart  Rate Range (THRR);Able to understand and use rate of perceived exertion (RPE) scale;Able to check pulse independently  Increase Physical Activity;Increase Strength and Stamina;Able to understand and use rate of perceived exertion (RPE) scale;Knowledge and understanding of Target Heart Rate Range (THRR);Able to check pulse  independently;Understanding of Exercise Prescription    Comments  Pt first day of CR program. Pt tolerated exercise well. Pt increased his treadmill speed to 2.9. Pt understands THRR, RPE scale, and exercise Rx.  Reviewed HEP with Pt. Pt expressed understanding of goals, THRR, RPE scale, weather precautions, end points of exercise, warm up and cool down stretches. Pt is currently walking at home 30-45 minutes 2-4 days in addition to CR program.  Reviewed METs and goals with Pt. Pt understands goals for exercise at home as well as MET level of 4.0. Pt continues to increase MET level and workloads. Pt is tolerating exercsie Rx well. Pt is exercising at home by walking 2-4 days for 30-45 minutes in addition to CR program.  Reviewed METs and goals with Pt. Pt continues to progress well and has a MET level of 4.1. Pt continues to increase workloads gradually. Pt is exercising at home by walking 2-4 days per week for 30-45 minutes in addition to CR program.    Expected Outcomes  Will continue to monitor and progress Pt as tolerated.  Will continue to monitor and progress Pt as tolerated.  Will continue to monitor and progress Pt as tolerated.  Will continue to monitor and progress Pt as tolerated.        Discharge Exercise Prescription (Final Exercise Prescription Changes): Exercise Prescription Changes - 12/16/18 0845      Response to Exercise   Blood Pressure (Admit)  108/70    Blood Pressure (Exercise)  136/80    Blood Pressure (Exit)  108/64    Heart Rate (Admit)  78 bpm    Heart Rate (Exercise)  128 bpm    Heart Rate (Exit)  78 bpm    Rating of Perceived Exertion (Exercise)  12     Symptoms  None    Duration  Continue with 30 min of aerobic exercise without signs/symptoms of physical distress.    Intensity  THRR unchanged      Progression   Progression  Continue to progress workloads to maintain intensity without signs/symptoms of physical distress.    Average METs  4.1      Resistance Training   Training Prescription  Yes    Weight  5 lbs.     Reps  10-15    Time  10 Minutes      Interval Training   Interval Training  No      Treadmill   MPH  3.2    Grade  1    Minutes  15      Bike   Level  4.5    Minutes  15    METs  4.4      Home Exercise Plan   Plans to continue exercise at  Home (comment)    Frequency  Add 4 additional days to program exercise sessions.    Initial Home Exercises Provided  11/22/18       Nutrition:  Target Goals: Understanding of nutrition guidelines, daily intake of sodium <1523m, cholesterol <2067m calories 30% from fat and 7% or less from saturated fats, daily to have 5 or more servings of fruits and vegetables.  Biometrics: Pre Biometrics - 11/07/18 1441      Pre Biometrics   Height  6' (1.829 m)    Weight  116.7 kg    Waist Circumference  37.5 inches    Hip Circumference  46 inches    Waist to Hip Ratio  0.82 %    BMI (Calculated)  34.89  Triceps Skinfold  34 mm    % Body Fat  31.9 %    Grip Strength  51 kg    Flexibility  0 in    Single Leg Stand  6.06 seconds        Nutrition Therapy Plan and Nutrition Goals:   Nutrition Assessments:   Nutrition Goals Re-Evaluation:   Nutrition Goals Discharge (Final Nutrition Goals Re-Evaluation):   Psychosocial: Target Goals: Acknowledge presence or absence of significant depression and/or stress, maximize coping skills, provide positive support system. Participant is able to verbalize types and ability to use techniques and skills needed for reducing stress and depression.  Initial Review & Psychosocial Screening: Initial Psych Review & Screening -  11/07/18 1510      Initial Review   Current issues with  None Identified      Family Dynamics   Good Support System?  Yes   Pt states that his wife and family are sources of support.     Barriers   Psychosocial barriers to participate in program  There are no identifiable barriers or psychosocial needs.      Screening Interventions   Interventions  Encouraged to exercise       Quality of Life Scores: Quality of Life - 11/07/18 1448      Quality of Life   Select  Quality of Life      Quality of Life Scores   Health/Function Pre  25.2 %    Socioeconomic Pre  30 %    Psych/Spiritual Pre  30 %    Family Pre  30 %    GLOBAL Pre  27.94 %      Scores of 19 and below usually indicate a poorer quality of life in these areas.  A difference of  2-3 points is a clinically meaningful difference.  A difference of 2-3 points in the total score of the Quality of Life Index has been associated with significant improvement in overall quality of life, self-image, physical symptoms, and general health in studies assessing change in quality of life.  PHQ-9: Recent Review Flowsheet Data    Depression screen [facility] 2/9 11/07/2018   Decreased Interest 0   Down, Depressed, Hopeless 0   PHQ - 2 Score 0     Interpretation of Total Score  Total Score Depression Severity:  1-4 = Minimal depression, 5-9 = Mild depression, 10-14 = Moderate depression, 15-19 = Moderately severe depression, 20-27 = Severe depression   Psychosocial Evaluation and Intervention: Psychosocial Evaluation - 11/11/18 1026      Psychosocial Evaluation & Interventions   Interventions  Encouraged to exercise with the program and follow exercise prescription    Comments  No psychosocial needs identified.  Star enjoys playing with his grandchildren.    Expected Outcomes  Abhimanyu will maintain a positive outlook with good coping skills.    Continue Psychosocial Services   No Follow up required       Psychosocial  Re-Evaluation: Psychosocial Re-Evaluation    [place] Name 11/22/18 0851 12/23/18 1156           Psychosocial Re-Evaluation   Current issues with  None Identified  None Identified      Interventions  Encouraged to attend Cardiac Rehabilitation for the exercise  Encouraged to attend Cardiac Rehabilitation for the exercise      Continue Psychosocial Services   No Follow up required  No Follow up required         Psychosocial Discharge (Final Psychosocial Re-Evaluation): Psychosocial Re-Evaluation -  12/23/18 1156      Psychosocial Re-Evaluation   Current issues with  None Identified    Interventions  Encouraged to attend Cardiac Rehabilitation for the exercise    Continue Psychosocial Services   No Follow up required       Vocational Rehabilitation: Provide vocational rehab assistance to qualifying candidates.   Vocational Rehab Evaluation & Intervention: Vocational Rehab - 11/07/18 1510      Initial Vocational Rehab Evaluation & Intervention   Assessment shows need for Vocational Rehabilitation  No       Education: Education Goals: Education classes will be provided on a weekly basis, covering required topics. Participant will state understanding/return demonstration of topics presented.  Learning Barriers/Preferences: Learning Barriers/Preferences - 11/07/18 1442      Learning Barriers/Preferences   Learning Barriers  None    Learning Preferences  Audio;Computer/Internet;Group Instruction;Individual Instruction;Skilled Demonstration;Pictoral;Verbal Instruction;Video;Written Material       Education Topics: Hypertension, Hypertension Reduction -Define heart disease and high blood pressure. Discus how high blood pressure affects the body and ways to reduce high blood pressure.   Exercise and Your Heart -Discuss why it is important to exercise, the FITT principles of exercise, normal and abnormal responses to exercise, and how to exercise safely.   Angina -Discuss  definition of angina, causes of angina, treatment of angina, and how to decrease risk of having angina.   Cardiac Medications -Review what the following cardiac medications are used for, how they affect the body, and side effects that may occur when taking the medications.  Medications include Aspirin, Beta blockers, calcium channel blockers, ACE Inhibitors, angiotensin receptor blockers, diuretics, digoxin, and antihyperlipidemics.   Congestive Heart Failure -Discuss the definition of CHF, how to live with CHF, the signs and symptoms of CHF, and how keep track of weight and sodium intake.   Heart Disease and Intimacy -Discus the effect sexual activity has on the heart, how changes occur during intimacy as we age, and safety during sexual activity.   Smoking Cessation / COPD -Discuss different methods to quit smoking, the health benefits of quitting smoking, and the definition of COPD.   Nutrition I: Fats -Discuss the types of cholesterol, what cholesterol does to the heart, and how cholesterol levels can be controlled.   Nutrition II: Labels -Discuss the different components of food labels and how to read food label   Heart Parts/Heart Disease and PAD -Discuss the anatomy of the heart, the pathway of blood circulation through the heart, and these are affected by heart disease.   Stress I: Signs and Symptoms -Discuss the causes of stress, how stress may lead to anxiety and depression, and ways to limit stress.   Stress II: Relaxation -Discuss different types of relaxation techniques to limit stress.   Warning Signs of Stroke / TIA -Discuss definition of a stroke, what the signs and symptoms are of a stroke, and how to identify when someone is having stroke.   Knowledge Questionnaire Score: Knowledge Questionnaire Score - 11/07/18 1442      Knowledge Questionnaire Score   Pre Score  23/24       Core Components/Risk Factors/Patient Goals at Admission: Personal Goals  and Risk Factors at Admission - 11/07/18 1442      Core Components/Risk Factors/Patient Goals on Admission    Weight Management  Yes;Obesity;Weight Maintenance;Weight Loss    Intervention  Weight Management: Develop a combined nutrition and exercise program designed to reach desired caloric intake, while maintaining appropriate intake of nutrient and fiber, sodium and  fats, and appropriate energy expenditure required for the weight goal.;Weight Management: Provide education and appropriate resources to help participant work on and attain dietary goals.;Weight Management/Obesity: Establish reasonable short term and long term weight goals.;Obesity: Provide education and appropriate resources to help participant work on and attain dietary goals.    Admit Weight  257 lb 4.4 oz (116.7 kg)    Expected Outcomes  Short Term: Continue to assess and modify interventions until short term weight is achieved;Long Term: Adherence to nutrition and physical activity/exercise program aimed toward attainment of established weight goal;Weight Maintenance: Understanding of the daily nutrition guidelines, which includes 25-35% calories from fat, 7% or less cal from saturated fats, less than 273m cholesterol, less than 1.5gm of sodium, & 5 or more servings of fruits and vegetables daily;Weight Loss: Understanding of general recommendations for a balanced deficit meal plan, which promotes 1-2 lb weight loss per week and includes a negative energy balance of [phone] kcal/d;Understanding recommendations for meals to include 15-35% energy as protein, 25-35% energy from fat, 35-60% energy from carbohydrates, less than 2077mof dietary cholesterol, 20-35 gm of total fiber daily;Understanding of distribution of calorie intake throughout the day with the consumption of 4-5 meals/snacks    Hypertension  Yes    Intervention  Monitor prescription use compliance.;Provide education on lifestyle modifcations including regular physical  activity/exercise, weight management, moderate sodium restriction and increased consumption of fresh fruit, vegetables, and low fat dairy, alcohol moderation, and smoking cessation.    Expected Outcomes  Short Term: Continued assessment and intervention until BP is < 140/9026mG in hypertensive participants. < 130/37m51m in hypertensive participants with diabetes, heart failure or chronic kidney disease.;Long Term: Maintenance of blood pressure at goal levels.    Lipids  Yes    Intervention  Provide education and support for participant on nutrition & aerobic/resistive exercise along with prescribed medications to achieve LDL <70mg38mL >40mg.70mExpected Outcomes  Short Term: Participant states understanding of desired cholesterol values and is compliant with medications prescribed. Participant is following exercise prescription and nutrition guidelines.;Long Term: Cholesterol controlled with medications as prescribed, with individualized exercise RX and with personalized nutrition plan. Value goals: LDL < 70mg, 49m> 40 mg.       Core Components/Risk Factors/Patient Goals Review:  Goals and Risk Factor Review    Row Name 11/11/18 1028 11/22/18 0851 12/23/18 1156         Core Components/Risk Factors/Patient Goals Review   Personal Goals Review  Weight Management/Obesity;Hypertension;Lipids  Weight Management/Obesity;Hypertension;Lipids  Weight Management/Obesity;Hypertension;Lipids     Review  Pt with CAD RFs willing to participate in CR exercise.  Colm wouuld like to lose weight and decrease his medications.  Pt with CAD RFs willing to participate in CR exercise.  Calahan wouuld like to lose weight and decrease his medications. Keyen haDerrelen doing well with exercise. Will continue to monitor BP  Pt with CAD RFs willing to participate in CR exercise.  Vere wouuld like to lose weight and decrease his medications. Donyel haRhysen doing well with exercise. Clee coDetricues to have frequent PVC's remains  asymptomatic.VSS     Expected Outcomes  Pt will continue to participate in CR exercise, nutrition, and lifestyle modification opportunities.  Pt will continue to participate in CR exercise, nutrition, and lifestyle modification opportunities.  Pt will continue to participate in CR exercise, nutrition, and lifestyle modification opportunities.        Core Components/Risk Factors/Patient Goals at Discharge (Final Review):  Goals and  Risk Factor Review - 12/23/18 1156      Core Components/Risk Factors/Patient Goals Review   Personal Goals Review  Weight Management/Obesity;Hypertension;Lipids    Review  Pt with CAD RFs willing to participate in CR exercise.  Ayham wouuld like to lose weight and decrease his medications. Hadden has been doing well with exercise. Fuller continues to have frequent PVC's remains asymptomatic.VSS    Expected Outcomes  Pt will continue to participate in CR exercise, nutrition, and lifestyle modification opportunities.       ITP Comments: ITP Comments    Row Name 11/07/18 1509 11/11/18 1026 11/22/18 0853 12/23/18 1156 12/23/18 1208   ITP Comments  Dr. Fransico Him, Medical Director  Pt started exercise today and tolerated it well.  30 Day ITP Review. Patient with good participation and attendance in phase 2 cardiac rehab.  30 Day ITP Review. Patient with good participation and attendance in phase 2 cardiac rehab.  30 Day ITP Review. Patient with good participation and attendance in phase 2 cardiac rehab. Elmon is doing well with weight loss.      Comments: See ITP comments.Barnet Pall, RN,BSN 12/26/2018 12:55 PM

## 2018-12-24 ENCOUNTER — Other Ambulatory Visit: Payer: Self-pay | Admitting: Cardiology

## 2018-12-25 ENCOUNTER — Encounter (HOSPITAL_COMMUNITY)
Admission: RE | Admit: 2018-12-25 | Discharge: 2018-12-25 | Disposition: A | Discharge status: Home / Self Care | Payer: Medicare Other | Source: Ambulatory Visit | Attending: Cardiovascular Disease | Admitting: Cardiovascular Disease

## 2018-12-25 ENCOUNTER — Other Ambulatory Visit: Payer: Self-pay

## 2018-12-25 DIAGNOSIS — I214 Non-ST elevation (NSTEMI) myocardial infarction: Secondary | ICD-10-CM

## 2018-12-25 NOTE — Progress Notes (Signed)
Entry blood pressure 94/64. Patient asymptomatic. Blood pressure noted at 124/72 on the scifit bike. Manual blood pressure hard to auscultate.Automatic blood pressure 91/52. Heart rate 89. Patient given Gatorade. Recheck BP 94/61. Vin Bhagat PA called and notified about low BP. No new order received.Patient instructed to increase his h20 intake and to keep of log of his blood pressures at home.Will continue to monitor the patient throughout  the program.Maria Venetia Maxon, RN,BSN 12/25/2018 10:25 AM

## 2018-12-27 ENCOUNTER — Other Ambulatory Visit: Payer: Self-pay

## 2018-12-27 ENCOUNTER — Encounter (HOSPITAL_COMMUNITY)
Admission: RE | Admit: 2018-12-27 | Discharge: 2018-12-27 | Disposition: A | Discharge status: Home / Self Care | Payer: Medicare Other | Source: Ambulatory Visit | Attending: Cardiovascular Disease | Admitting: Cardiovascular Disease

## 2018-12-27 VITALS — Ht 72.0 in | Wt 248.2 lb

## 2018-12-27 DIAGNOSIS — I214 Non-ST elevation (NSTEMI) myocardial infarction: Secondary | ICD-10-CM | POA: Diagnosis not present

## 2018-12-30 ENCOUNTER — Encounter (HOSPITAL_COMMUNITY)
Admission: RE | Admit: 2018-12-30 | Discharge: 2018-12-30 | Disposition: A | Discharge status: Home / Self Care | Payer: Medicare Other | Source: Ambulatory Visit | Attending: Cardiovascular Disease | Admitting: Cardiovascular Disease

## 2018-12-30 ENCOUNTER — Other Ambulatory Visit: Payer: Self-pay

## 2018-12-30 DIAGNOSIS — N400 Enlarged prostate without lower urinary tract symptoms: Secondary | ICD-10-CM | POA: Diagnosis not present

## 2018-12-30 DIAGNOSIS — I214 Non-ST elevation (NSTEMI) myocardial infarction: Secondary | ICD-10-CM

## 2019-01-01 ENCOUNTER — Other Ambulatory Visit: Payer: Self-pay

## 2019-01-01 ENCOUNTER — Encounter (HOSPITAL_COMMUNITY)
Admission: RE | Admit: 2019-01-01 | Discharge: 2019-01-01 | Disposition: A | Discharge status: Home / Self Care | Payer: Medicare Other | Source: Ambulatory Visit | Attending: Cardiovascular Disease | Admitting: Cardiovascular Disease

## 2019-01-01 DIAGNOSIS — I214 Non-ST elevation (NSTEMI) myocardial infarction: Secondary | ICD-10-CM

## 2019-01-03 ENCOUNTER — Encounter (HOSPITAL_COMMUNITY)
Admission: RE | Admit: 2019-01-03 | Discharge: 2019-01-03 | Disposition: A | Discharge status: Home / Self Care | Payer: Medicare Other | Source: Ambulatory Visit | Attending: Cardiovascular Disease | Admitting: Cardiovascular Disease

## 2019-01-03 ENCOUNTER — Other Ambulatory Visit: Payer: Self-pay

## 2019-01-03 DIAGNOSIS — I214 Non-ST elevation (NSTEMI) myocardial infarction: Secondary | ICD-10-CM

## 2019-01-03 NOTE — Progress Notes (Signed)
Discharge Progress Report  Patient Details  Name: Marcus Parsons MRN: [id number] Date of Birth: 10/24/1945 Referring Provider:     CARDIAC REHAB PHASE II ORIENTATION from 11/07/2018 in [place]  Referring Provider  Dr. Johnsie Cancel        Number of Visits: 24  Reason for Discharge:  Patient reached a stable level of exercise. Patient independent in their exercise. Patient has met program and personal goals.  Smoking History:  Social History   Tobacco Use  Smoking Status Never Smoker  Smokeless Tobacco Never Used    Diagnosis:  NSTEMI (non-ST elevated myocardial infarction) ([place])  ADL [facility]:   Initial Exercise Prescription: Initial Exercise Prescription - 11/07/18 1400      Date of Initial Exercise RX and Referring Provider   Date  11/07/18    Referring Provider  Dr. Johnsie Cancel     Expected Discharge Date  01/03/19      Treadmill   MPH  2.7    Grade  1    Minutes  15      Bike   Level  2.5    Watts  45    Minutes  15    METs  3.23      Prescription Details   Frequency (times per week)  3    Duration  Progress to 30 minutes of continuous aerobic without signs/symptoms of physical distress      Intensity   THRR 40-80% of Max Heartrate  60-120    Ratings of Perceived Exertion  11-13      Progression   Progression  Continue to progress workloads to maintain intensity without signs/symptoms of physical distress.      Resistance Training   Training Prescription  Yes    Weight  4 lbs.     Reps  10-15       Discharge Exercise Prescription (Final Exercise Prescription Changes): Exercise Prescription Changes - 01/03/19 1500      Response to Exercise   Blood Pressure (Admit)  106/62    Blood Pressure (Exercise)  130/72    Blood Pressure (Exit)  108/62    Heart Rate (Admit)  82 bpm    Heart Rate (Exercise)  125 bpm    Heart Rate (Exit)  90 bpm    Rating of Perceived Exertion (Exercise)  12    Symptoms  None    Comments  Pt last  day of exercise.     Duration  Continue with 30 min of aerobic exercise without signs/symptoms of physical distress.    Intensity  THRR unchanged      Progression   Progression  Continue to progress workloads to maintain intensity without signs/symptoms of physical distress.    Average METs  4.3      Resistance Training   Training Prescription  Yes    Weight  5 lbs.     Reps  10-15    Time  10 Minutes      Interval Training   Interval Training  No      Treadmill   MPH  3.2    Grade  1    Minutes  15      Bike   Level  4.5    Minutes  15    METs  4.8      Home Exercise Plan   Plans to continue exercise at  Home (comment)    Frequency  Add 4 additional days to program exercise sessions.    Initial  Home Exercises Provided  11/22/18       Functional Capacity: 6 Minute Walk    Row Name 11/07/18 1436 12/27/18 1115       6 Minute Walk   Phase  Initial  Discharge    Distance  1657 feet  2090 feet    Distance % Change  -  26.13 %    Distance Feet Change  -  433 ft    Walk Time  6 minutes  6 minutes    # of Rest Breaks  0  0    MPH  3.1  3.9    METS  3.2  4.1    RPE  9  12    Perceived Dyspnea   0  0    VO2 Peak  11.49  14.42    Symptoms  No  No    Resting HR  84 bpm  83 bpm    Resting BP  124/68  106/62    Resting Oxygen Saturation   97 %  -    Exercise Oxygen Saturation  during 6 min walk  96 %  -    Max Ex. HR  119 bpm  121 bpm    Max Ex. BP  130/70  128/64    2 Minute Post BP  120/70  100/58       Psychological, QOL, Others - Outcomes: PHQ 2/9: Depression screen [facility] 2/9 01/01/2019 11/07/2018  Decreased Interest 0 0  Down, Depressed, Hopeless 0 0  PHQ - 2 Score 0 0    Quality of Life: Quality of Life - 12/27/18 1021      Quality of Life   Select  Quality of Life      Quality of Life Scores   Health/Function Post  27.6 %    Socioeconomic Post  30 %    Psych/Spiritual Post  30 %    Family Post  30 %    GLOBAL Post  28.94 %       Personal  Goals: Goals established at orientation with interventions provided to work toward goal. Personal Goals and Risk Factors at Admission - 11/07/18 1442      Core Components/Risk Factors/Patient Goals on Admission    Weight Management  Yes;Obesity;Weight Maintenance;Weight Loss    Intervention  Weight Management: Develop a combined nutrition and exercise program designed to reach desired caloric intake, while maintaining appropriate intake of nutrient and fiber, sodium and fats, and appropriate energy expenditure required for the weight goal.;Weight Management: Provide education and appropriate resources to help participant work on and attain dietary goals.;Weight Management/Obesity: Establish reasonable short term and long term weight goals.;Obesity: Provide education and appropriate resources to help participant work on and attain dietary goals.    Admit Weight  257 lb 4.4 oz (116.7 kg)    Expected Outcomes  Short Term: Continue to assess and modify interventions until short term weight is achieved;Long Term: Adherence to nutrition and physical activity/exercise program aimed toward attainment of established weight goal;Weight Maintenance: Understanding of the daily nutrition guidelines, which includes 25-35% calories from fat, 7% or less cal from saturated fats, less than 230m cholesterol, less than 1.5gm of sodium, & 5 or more servings of fruits and vegetables daily;Weight Loss: Understanding of general recommendations for a balanced deficit meal plan, which promotes 1-2 lb weight loss per week and includes a negative energy balance of [phone] kcal/d;Understanding recommendations for meals to include 15-35% energy as protein, 25-35% energy from fat, 35-60% energy from carbohydrates, less  than 246m of dietary cholesterol, 20-35 gm of total fiber daily;Understanding of distribution of calorie intake throughout the day with the consumption of 4-5 meals/snacks    Hypertension  Yes    Intervention  Monitor  prescription use compliance.;Provide education on lifestyle modifcations including regular physical activity/exercise, weight management, moderate sodium restriction and increased consumption of fresh fruit, vegetables, and low fat dairy, alcohol moderation, and smoking cessation.    Expected Outcomes  Short Term: Continued assessment and intervention until BP is < 140/940mHG in hypertensive participants. < 130/802mG in hypertensive participants with diabetes, heart failure or chronic kidney disease.;Long Term: Maintenance of blood pressure at goal levels.    Lipids  Yes    Intervention  Provide education and support for participant on nutrition & aerobic/resistive exercise along with prescribed medications to achieve LDL <40m40mDL >40mg64m Expected Outcomes  Short Term: Participant states understanding of desired cholesterol values and is compliant with medications prescribed. Participant is following exercise prescription and nutrition guidelines.;Long Term: Cholesterol controlled with medications as prescribed, with individualized exercise RX and with personalized nutrition plan. Value goals: LDL < 40mg,53m > 40 mg.        Personal Goals Discharge: Goals and Risk Factor Review    Row Name 11/11/18 1028 11/22/18 0851 12/23/18 1156         Core Components/Risk Factors/Patient Goals Review   Personal Goals Review  Weight Management/Obesity;Hypertension;Lipids  Weight Management/Obesity;Hypertension;Lipids  Weight Management/Obesity;Hypertension;Lipids     Review  Pt with CAD RFs willing to participate in CR exercise.  Rayane wouuld like to lose weight and decrease his medications.  Pt with CAD RFs willing to participate in CR exercise.  Randolf wouuld like to lose weight and decrease his medications. Tiegan hRolineen doing well with exercise. Will continue to monitor BP  Pt with CAD RFs willing to participate in CR exercise.  Kiernan wouuld like to lose weight and decrease his medications. Jamiere hNycholaseen  doing well with exercise. Divonte cEribertonues to have frequent PVC's remains asymptomatic.VSS     Expected Outcomes  Pt will continue to participate in CR exercise, nutrition, and lifestyle modification opportunities.  Pt will continue to participate in CR exercise, nutrition, and lifestyle modification opportunities.  Pt will continue to participate in CR exercise, nutrition, and lifestyle modification opportunities.        Exercise Goals and Review: Exercise Goals    Row Name 11/07/18 1441             Exercise Goals   Increase Physical Activity  Yes       Intervention  Provide advice, education, support and counseling about physical activity/exercise needs.;Develop an individualized exercise prescription for aerobic and resistive training based on initial evaluation findings, risk stratification, comorbidities and participant's personal goals.       Expected Outcomes  Short Term: Attend rehab on a regular basis to increase amount of physical activity.;Long Term: Add in home exercise to make exercise part of routine and to increase amount of physical activity.;Long Term: Exercising regularly at least 3-5 days a week.       Increase Strength and Stamina  Yes       Intervention  Provide advice, education, support and counseling about physical activity/exercise needs.;Develop an individualized exercise prescription for aerobic and resistive training based on initial evaluation findings, risk stratification, comorbidities and participant's personal goals.       Expected Outcomes  Short Term: Increase workloads from initial exercise prescription for resistance, speed,  and METs.;Short Term: Perform resistance training exercises routinely during rehab and add in resistance training at home;Long Term: Improve cardiorespiratory fitness, muscular endurance and strength as measured by increased METs and functional capacity (6MWT)       Able to understand and use rate of perceived exertion (RPE) scale  Yes        Intervention  Provide education and explanation on how to use RPE scale       Expected Outcomes  Short Term: Able to use RPE daily in rehab to express subjective intensity level;Long Term:  Able to use RPE to guide intensity level when exercising independently       Knowledge and understanding of Target Heart Rate Range (THRR)  Yes       Intervention  Provide education and explanation of THRR including how the numbers were predicted and where they are located for reference       Expected Outcomes  Short Term: Able to state/look up THRR;Long Term: Able to use THRR to govern intensity when exercising independently;Short Term: Able to use daily as guideline for intensity in rehab       Able to check pulse independently  Yes       Intervention  Provide education and demonstration on how to check pulse in carotid and radial arteries.;Review the importance of being able to check your own pulse for safety during independent exercise       Expected Outcomes  Short Term: Able to explain why pulse checking is important during independent exercise;Long Term: Able to check pulse independently and accurately       Understanding of Exercise Prescription  Yes       Intervention  Provide education, explanation, and written materials on patient's individual exercise prescription       Expected Outcomes  Short Term: Able to explain program exercise prescription;Long Term: Able to explain home exercise prescription to exercise independently          Exercise Goals Re-Evaluation: Exercise Goals Re-Evaluation    Row Name 11/11/18 1125 11/22/18 1032 11/25/18 1159 12/26/18 0821 01/03/19 1542     Exercise Goal Re-Evaluation   Exercise Goals Review  Increase Physical Activity;Increase Strength and Stamina;Able to understand and use rate of perceived exertion (RPE) scale;Knowledge and understanding of Target Heart Rate Range (THRR);Understanding of Exercise Prescription  Increase Physical Activity;Increase Strength and  Stamina;Able to understand and use rate of perceived exertion (RPE) scale;Knowledge and understanding of Target Heart Rate Range (THRR);Able to check pulse independently;Understanding of Exercise Prescription  Increase Physical Activity;Understanding of Exercise Prescription;Increase Strength and Stamina;Knowledge and understanding of Target Heart Rate Range (THRR);Able to understand and use rate of perceived exertion (RPE) scale;Able to check pulse independently  Increase Physical Activity;Increase Strength and Stamina;Able to understand and use rate of perceived exertion (RPE) scale;Knowledge and understanding of Target Heart Rate Range (THRR);Able to check pulse independently;Understanding of Exercise Prescription  Increase Physical Activity;Increase Strength and Stamina;Able to understand and use rate of perceived exertion (RPE) scale;Knowledge and understanding of Target Heart Rate Range (THRR);Able to check pulse independently;Understanding of Exercise Prescription   Comments  Pt first day of CR program. Pt tolerated exercise well. Pt increased his treadmill speed to 2.9. Pt understands THRR, RPE scale, and exercise Rx.  Reviewed HEP with Pt. Pt expressed understanding of goals, THRR, RPE scale, weather precautions, end points of exercise, warm up and cool down stretches. Pt is currently walking at home 30-45 minutes 2-4 days in addition to CR program.  Reviewed METs and  goals with Pt. Pt understands goals for exercise at home as well as MET level of 4.0. Pt continues to increase MET level and workloads. Pt is tolerating exercsie Rx well. Pt is exercising at home by walking 2-4 days for 30-45 minutes in addition to CR program.  Reviewed METs and goals with Pt. Pt continues to progress well and has a MET level of 4.1. Pt continues to increase workloads gradually. Pt is exercising at home by walking 2-4 days per week for 30-45 minutes in addition to CR program.  Pt graduated CR program today. Pt progressed well  in the program and ended with a MET level of 4.3. Pt increased workloads and MET levels gradually. Pt states he will continue to exercsie at home by walking 5-7 days per week for 30-45 minutes per day.   Expected Outcomes  Will continue to monitor and progress Pt as tolerated.  Will continue to monitor and progress Pt as tolerated.  Will continue to monitor and progress Pt as tolerated.  Will continue to monitor and progress Pt as tolerated.  Will continue to walk at home for exercise.      Nutrition & Weight - Outcomes: Pre Biometrics - 11/07/18 1441      Pre Biometrics   Height  6' (1.829 m)    Weight  257 lb 4.4 oz (116.7 kg)    Waist Circumference  37.5 inches    Hip Circumference  46 inches    Waist to Hip Ratio  0.82 %    BMI (Calculated)  34.89    Triceps Skinfold  34 mm    % Body Fat  31.9 %    Grip Strength  51 kg    Flexibility  0 in    Single Leg Stand  6.06 seconds      Post Biometrics - 12/27/18 1119       Post  Biometrics   Height  6' (1.829 m)    Weight  248 lb 3.8 oz (112.6 kg)    Waist Circumference  45 inches    Hip Circumference  45 inches    Waist to Hip Ratio  1 %    BMI (Calculated)  33.66    Triceps Skinfold  23 mm    % Body Fat  33.5 %    Grip Strength  45 kg    Flexibility  11 in    Single Leg Stand  4.25 seconds       Nutrition:   Nutrition Discharge:   Education Questionnaire Score: Knowledge Questionnaire Score - 12/27/18 1019      Knowledge Questionnaire Score   Post Score  24/24       Goals reviewed with patient; copy given to patient.Pt graduated from cardiac rehab program on 01/03/19 with completion of 24 exercise sessions in Phase II. Pt maintained good attendance and progressed nicely during his participation in rehab as evidenced by increased MET level.   Medication list reconciled. Repeat  PHQ score- 0.  Pt has made significant lifestyle changes and should be commended for his success. Pt feels he has achieved his goals during  cardiac rehab.   Pt plans to continue exercise by walking. We are proud of Tamika's effort's. Jhovany increased his distance on his post exercise walk test and lost 4.1kg!

## 2019-01-06 DIAGNOSIS — Z125 Encounter for screening for malignant neoplasm of prostate: Secondary | ICD-10-CM | POA: Diagnosis not present

## 2019-01-06 DIAGNOSIS — R350 Frequency of micturition: Secondary | ICD-10-CM | POA: Diagnosis not present

## 2019-01-06 DIAGNOSIS — N401 Enlarged prostate with lower urinary tract symptoms: Secondary | ICD-10-CM | POA: Diagnosis not present

## 2019-03-10 ENCOUNTER — Other Ambulatory Visit: Payer: Self-pay

## 2019-03-10 ENCOUNTER — Encounter: Payer: Self-pay | Admitting: Cardiology

## 2019-03-10 ENCOUNTER — Ambulatory Visit (INDEPENDENT_AMBULATORY_CARE_PROVIDER_SITE_OTHER): Payer: Medicare Other | Admitting: Cardiology

## 2019-03-10 VITALS — BP 112/84 | HR 85 | Ht 72.0 in | Wt 244.8 lb

## 2019-03-10 DIAGNOSIS — I5020 Unspecified systolic (congestive) heart failure: Secondary | ICD-10-CM | POA: Diagnosis not present

## 2019-03-10 DIAGNOSIS — I493 Ventricular premature depolarization: Secondary | ICD-10-CM

## 2019-03-10 NOTE — Patient Instructions (Signed)
Medication Instructions:  Your physician recommends that you continue on your current medications as directed. Please refer to the Current Medication list given to you today.  * If you need a refill on your cardiac medications before your next appointment, please call your pharmacy.   Labwork: None ordered If you have labs (blood work) drawn today and your tests are completely normal, you will receive your results only by:  [place] (if you have MyChart) OR  A paper copy in the mail If you have any lab test that is abnormal or we need to change your treatment, we will call you to review the results.  Testing/Procedures: Your physician has requested that you have an echocardiogram. Echocardiography is a painless test that uses sound waves to create images of your heart. It provides your doctor with information about the size and shape of your heart and how well your heart's chambers and valves are working. This procedure takes approximately one hour. There are no restrictions for this procedure.  Follow-Up: At [facility], you and your health needs are our priority.  As part of our continuing mission to provide you with exceptional heart care, we have created designated Provider Care Teams.  These Care Teams include your primary Cardiologist (physician) and Advanced Practice Providers (APPs -  Physician Assistants and Nurse Practitioners) who all work together to provide you with the care you need, when you need it.  You will need a follow up appointment in 6 months.  Please call our office 2 months in advance to schedule this appointment.  You may see Dr Curt Bears or one of the following Advanced Practice Providers on your designated Care Team:    Chanetta Marshall, NP  Tommye Standard, PA-C  Oda Kilts, [place]  Thank you for choosing [facility]!!   Trinidad Curet, RN [phone]  Any Other Special Instructions Will Be Listed Below (If Applicable).

## 2019-03-10 NOTE — Progress Notes (Signed)
Electrophysiology Office Note   Date:  03/10/2019   ID:  Asahd, Leisinger 12/10/1945, MRN [id number]  PCP:  Lujean Amel, MD  Cardiologist: Johnsie Cancel Primary Electrophysiologist:  Dustyn Dansereau Meredith Leeds, MD    Chief Complaint: PVC   History of Present Illness: Marcus Parsons is a 74 y.o. male who is being seen today for the evaluation of PVC at the request of Jenkins Rouge. Presenting today for electrophysiology evaluation.  He has a history of coronary artery disease status post STEMI in 2015 status post drug-eluting stents to the RCA, hypertension, OSA.  He wore a cardiac monitor that showed approximately 25% PVCs.  Today, denies symptoms of palpitations, chest pain, shortness of breath, orthopnea, PND, lower extremity edema, claudication, dizziness, presyncope, syncope, bleeding, or neurologic sequela. The patient is tolerating medications without difficulties.  Overall he is doing well.  He is unaware of his PVCs.  He is able to do all his daily activities without restriction.   Past Medical History:  Diagnosis Date  . Acute myocardial infarction ([place]) 04/2013   INFERIOR  . Atherosclerosis of coronary artery   . Benign prostatic hypertrophy   . Carotid stenosis   . Hyperlipidemia   . Obstructive sleep apnea   . S/P coronary artery stent placement    Past Surgical History:  Procedure Laterality Date  . HERNIA REPAIR    . KNEE SURGERY     AS CHILD  . LEFT HEART CATH AND CORONARY ANGIOGRAPHY N/A 04/24/2018   Procedure: LEFT HEART CATH AND CORONARY ANGIOGRAPHY;  Surgeon: Martinique, Peter M, MD;  Location: [facility];  Service: Cardiovascular;  Laterality: N/A;     Current Outpatient Medications  Medication Sig Dispense Refill  . aspirin EC 81 MG tablet Take 81 mg by mouth daily.    Marland Kitchen atorvastatin (LIPITOR) 80 MG tablet Take 1 tablet (80 mg total) by mouth at bedtime. 90 tablet 3  . cholecalciferol (VITAMIN D3) 25 MCG (1000 UT) tablet Take 1,000 Units by mouth daily.     Marland Kitchen dutasteride (AVODART) 0.5 MG capsule Take 1 capsule (0.5 mg total) by mouth daily. 90 capsule 3  . metoprolol succinate (TOPROL XL) 25 MG 24 hr tablet Take 0.5 tablets (12.5 mg total) by mouth daily. 45 tablet 3  . mexiletine (MEXITIL) 250 MG capsule Take 1 capsule (250 mg total) by mouth 2 (two) times daily. 180 capsule 1  . nitroGLYCERIN (NITROSTAT) 0.4 MG SL tablet Place 1 tablet (0.4 mg total) under the tongue every 5 (five) minutes as needed for chest pain. 25 tablet 5  . olmesartan (BENICAR) 40 MG tablet TAKE ONE-HALF (1/2) TABLET DAILY 45 tablet 3  . prasugrel (EFFIENT) 10 MG TABS tablet Take 1 tablet (10 mg total) by mouth daily. 90 tablet 3  . PRESCRIPTION MEDICATION CPAP: At bedtime    . Probiotic Product (PROBIOTIC DAILY) CAPS Take 1 capsule by mouth daily. 90 capsule 3  . tamsulosin (FLOMAX) 0.4 MG CAPS capsule Take 0.4 mg by mouth daily.    . isosorbide mononitrate (IMDUR) 30 MG 24 hr tablet Take 0.5 tablets (15 mg total) by mouth daily. 45 tablet 3   No current facility-administered medications for this visit.    Allergies:   Plavix [clopidogrel bisulfate]   Social History:  The patient  reports that he has never smoked. He has never used smokeless tobacco. He reports that he does not drink alcohol or use drugs.   Family History:  The patient's family history includes CAD in his  paternal uncle; Dementia in his mother; Diabetes Mellitus I in his father; Heart disease in his paternal uncle; Hypertension in his father; Leukemia in his maternal uncle; Lung cancer in his father.   ROS:  Please see the history of present illness.   Otherwise, review of systems is positive for none.   All other systems are reviewed and negative.   PHYSICAL EXAM: VS:  BP 112/84   Pulse 85   Ht 6' (1.829 m)   Wt 244 lb 12.8 oz (111 kg)   SpO2 96%   BMI 33.20 kg/m  , BMI Body mass index is 33.2 kg/m. GEN: Well nourished, well developed, in no acute distress  HEENT: normal  Neck: no JVD,  carotid bruits, or masses Cardiac: irregular; no murmurs, rubs, or gallops,no edema  Respiratory:  clear to auscultation bilaterally, normal work of breathing GI: soft, nontender, nondistended, + BS MS: no deformity or atrophy  Skin: warm and dry Neuro:  Strength and sensation are intact Psych: euthymic mood, full affect  EKG:  EKG is ordered today. Personal review of the ekg ordered shows sinus rhythm, rate 85, PVCs  Recent Labs: 04/24/2018: B Natriuretic Peptide 42.5 04/25/2018: Hemoglobin 12.8; Platelets 193 07/19/2018: ALT 27 11/18/2018: BUN 14; Creatinine, Ser 1.08; Magnesium 1.8; Potassium 4.1; Sodium 140    Lipid Panel     Component Value Date/Time   CHOL 105 07/19/2018 0833   TRIG 155 (H) 07/19/2018 0833   HDL 33 (L) 07/19/2018 0833   CHOLHDL 3.2 07/19/2018 0833   CHOLHDL 3.7 04/24/2018 0630   VLDL 34 04/24/2018 0630   LDLCALC 41 07/19/2018 0833     Wt Readings from Last 3 Encounters:  03/10/19 244 lb 12.8 oz (111 kg)  12/27/18 248 lb 3.8 oz (112.6 kg)  12/16/18 252 lb (114.3 kg)      Other studies Reviewed: Additional studies/ records that were reviewed today include: [place] 04/24/18  Review of the above records today demonstrates:   Previously placed Mid RCA stent (unknown type) is widely patent.  Ost 1st Diag to 1st Diag lesion is 100% stenosed.  LV end diastolic pressure is normal.   1. Single vessel occlusive CAD involving a small first diagonal. The stent in the mid RCA is widely patent. Diffuse coronary ectasia 2. Normal LVEDP  TTE 04/24/18  1. Mild hypokinesis of the left ventricular, mid-apical inferoseptal wall, inferior wall, inferolateral wall and lateral wall.  2. The left ventricle has mildly reduced systolic function, with an ejection fraction of 45-50%. The cavity size was normal. There is moderate concentric left ventricular hypertrophy. Left ventricular diastolic Doppler parameters are consistent with  impaired relaxation.  3. The right  ventricle has normal systolic function. The cavity was normal. There is no increase in right ventricular wall thickness.  4. Left atrial size was moderately dilated.  5. Mild thickening of the mitral valve leaflet. Mild calcification of the mitral valve leaflet.  6. The aortic valve is tricuspid Mild thickening of the aortic valve Mild calcification of the aortic valve. Aortic valve regurgitation is trivial by color flow Doppler.  Cardiac monitor 11/28/18 NSR average HR 71 PAC;s/Atrial arrhythmias <1% PVC;s frequent minimal NSVT  PVC;s high burden 24.6% of beats   ASSESSMENT AND PLAN:  1.  PVCs: High burden of PVCs at almost 25%..  Left ventricle.  Currently on mexiletine as he is minimally symptomatic.  As he is minimally symptomatic, we Shaquala Broeker continue with current management.  He does not wish to have an ablation at  this time or make medication adjustments.  We Brondon Wann update his echo to make sure that his ejection fraction has not gone down.  2.  Coronary artery disease: Status post RCA stent.  No current chest pain.  3.  Hypertension: Currently well controlled  4.  Hyperlipidemia: Lipitor per primary cardiology  5.  Chronic systolic heart failure due to ischemic cardiomyopathy: Ejection fraction 45 to 50%.  Has had an MI in the past.  Currently on ARB per primary cardiology.  Current medicines are reviewed at length with the patient today.   The patient does not have concerns regarding his medicines.  The following changes were made today: None  Labs/ tests ordered today include:  Orders Placed This Encounter  Procedures  . EKG 12-Lead  . ECHOCARDIOGRAM COMPLETE     Disposition:   FU with Anakin Varkey 6 months  Signed, Gearldean Lomanto Meredith Leeds, MD  03/10/2019 11:01 AM     [facility] [address] [place] [address] [phone] (office) [phone] (fax)

## 2019-03-14 ENCOUNTER — Ambulatory Visit: Payer: Medicare Other

## 2019-03-19 ENCOUNTER — Other Ambulatory Visit: Payer: Self-pay

## 2019-03-19 ENCOUNTER — Ambulatory Visit (HOSPITAL_COMMUNITY): Payer: Medicare Other | Attending: Cardiology

## 2019-03-19 DIAGNOSIS — I493 Ventricular premature depolarization: Secondary | ICD-10-CM | POA: Diagnosis not present

## 2019-03-19 DIAGNOSIS — I5020 Unspecified systolic (congestive) heart failure: Secondary | ICD-10-CM | POA: Diagnosis not present

## 2019-03-23 ENCOUNTER — Ambulatory Visit: Payer: Medicare Other

## 2019-03-24 ENCOUNTER — Other Ambulatory Visit: Payer: Self-pay | Admitting: Cardiology

## 2019-03-31 ENCOUNTER — Ambulatory Visit: Payer: Medicare Other

## 2019-04-16 DIAGNOSIS — G4733 Obstructive sleep apnea (adult) (pediatric): Secondary | ICD-10-CM | POA: Diagnosis not present

## 2019-07-07 ENCOUNTER — Other Ambulatory Visit: Payer: Self-pay | Admitting: Cardiology

## 2019-07-07 DIAGNOSIS — Z79899 Other long term (current) drug therapy: Secondary | ICD-10-CM

## 2019-07-07 DIAGNOSIS — I493 Ventricular premature depolarization: Secondary | ICD-10-CM

## 2019-07-08 ENCOUNTER — Telehealth: Payer: Self-pay | Admitting: Cardiovascular Disease

## 2019-07-08 NOTE — Telephone Encounter (Signed)
No answer on phone number provided. Will try again later.

## 2019-07-08 NOTE — Telephone Encounter (Signed)
Patient aware that Dr. Curt Bears wants to see him every 6 months. Will have patient follow up with Dr. Johnsie Cancel next available and will see how often he wants him to follow up.

## 2019-07-08 NOTE — Telephone Encounter (Signed)
I need to see the patient back every 6 months for his PVCs and his antiarrhythmic medication management other visit should be per Dr. Nolon Lennert.  If he feels that once a year is reasonable, would continue at that interval.

## 2019-07-08 NOTE — Telephone Encounter (Signed)
Follow up  ° ° °Pt returning call  °

## 2019-07-08 NOTE — Telephone Encounter (Signed)
Called patient back about message. Informed patient that for now, Dr Curt Bears is wanting to see him every 6 months. Encouraged patient to ask Dr. Curt Bears when he wants patient to see Dr. Johnsie Cancel again at his next visit. Informed patient that some times patient's with an EP doctor will see them once a year and see their Primary cardiologist once a year, but at opposite times of the year. Will see what Dr. Curt Bears suggest.

## 2019-07-08 NOTE — Telephone Encounter (Signed)
Marcus Parsons is calling due to receiving a recall letter to schedule an overdue appointment with Dr. Johnsie Cancel. He states Dr. Johnsie Cancel transferred him over to Dr. Curt Bears and wanted to clarify with Dr. Johnsie Cancel that he still felt he needed to be seen by him as well before scheduling. Please advise.

## 2019-08-10 ENCOUNTER — Other Ambulatory Visit: Payer: Self-pay | Admitting: Cardiology

## 2019-08-13 ENCOUNTER — Telehealth: Payer: Self-pay | Admitting: Cardiology

## 2019-08-13 NOTE — Telephone Encounter (Signed)
Pt states he was refilling all his prescriptions last week through [facility], and they contacted him about his Imdur, stating that this medication has "expired and appears to have been a short term medication vs long-term." Pt states the Pharmacist at [facility] told him the "medication is no longer needed." Pt states he was given this medication in the hospital last March for chest pain, and prescribed by Cecilie Kicks NP at that time.  Pt states he took this medication up until the last week, when his refills ran out.  Pt states he prefers not being on the medication if Dr. Johnsie Cancel or Dr. Curt Bears advise its ok to stop this indefinitely.  Pt states he has had no cardiac symptoms AT ALL, while he was taking the medication, and while being off of the medication since last week. He states his BP has been doing quite well, and he feels he is doing very well from a cardiac standpoint.  Pt states he has a yearly follow-up appt with Dr. Johnsie Cancel on 7/9.  Pt states he wants to comply with whatever his Cardiologist suggest with his previous Imdur regimen, but he prefers, at all cost, coming off the medication indefinitely, for he prefers decreasing the amount of meds he's taking on a daily basis. Informed the pt that Dr. Johnsie Cancel and Dr. Curt Bears are both out of the office today, but I will route this message to them to further review and advise on this.  Informed the pt someone from the office follow-up with him thereafter, once further recommendations are received.  Pt verbalized understanding and agrees with this plan.

## 2019-08-13 NOTE — Telephone Encounter (Signed)
Pt c/o medication issue:  1. Name of Medication: isosorbide mononitrate (IMDUR) 30 MG 24 hr tablet  2. How are you currently taking this medication (dosage and times per day)? Half a tablet daily  3. Are you having a reaction (difficulty breathing--STAT)? no  4. What is your medication issue? Patient states the pharmacy told him the prescription was canceled. He states they were told he does not need to take it anymore. He would like a call back to confirm whether he needs to continue or stop the medication.

## 2019-08-14 NOTE — Telephone Encounter (Signed)
Advised of recommendation. Aware that I will remove Imdur medication from his list and stressed to discuss further w/ Dr. Johnsie Cancel next week at scheduled follow up. Patient verbalized understanding and agreeable to plan.

## 2019-08-14 NOTE — Telephone Encounter (Signed)
If feeling well off of Imdur, okay to hold until he sees [place] in clinic next.

## 2019-08-19 NOTE — Progress Notes (Signed)
Date:  08/22/2019   ID:  Marcus, Parsons [birth year], MRN [id number]  PCP:  Lujean Amel, MD  Cardiologist:  Jenkins Rouge, MD  Electrophysiologist:  Constance Haw, MD   Evaluation Performed:  Follow-Up Visit  Chief Complaint:  CAD  History of Present Illness:    Marcus Parsons is a 74 y.o. male with a hx of CAD s/p STEMI in 2015 ([facility] to [facility]), as well as HTN, OSA who presentedafter an episode of CP 04/24/18  Per report, patient playing soccer with 32 year old grandson. Post-exercise he developed L sided CP that radiated into his L arm. He trialed SLNTG x 1 at home without relief and EMS was activated.CP was associated with mild SOB but no diaphoresis, dizziness, or nausea  IV NTG with relief.  He underwent cardiac cath 04/24/18 and patent stent in RCA but new 100% stenosed 1st diag.  Unable to intervene -medical therapy recommended.  Echo with EF 50-55% 03/19/19 .  pk troponin 8.75   Pt developed rash after discharge and plavix stopped and effient added, now resolved and no issues with the Effient. Now off imdur that was prescribed in hospital   No angina Imdur stopped More tremors in hands Discussed changing beta blocker to Inderal LA  Took his son to [place] to see [place]    The patient does not have symptoms concerning for COVID-19 infection (fever, chills, cough, or new shortness of breath).  Has had vaccine   Past Medical History:  Diagnosis Date  . Acute myocardial infarction ([place]) 04/2013   INFERIOR  . Atherosclerosis of coronary artery   . Benign prostatic hypertrophy   . Carotid stenosis   . Hyperlipidemia   . Obstructive sleep apnea   . S/P coronary artery stent placement    Past Surgical History:  Procedure Laterality Date  . HERNIA REPAIR    . KNEE SURGERY     AS CHILD  . LEFT HEART CATH AND CORONARY ANGIOGRAPHY N/A 04/24/2018   Procedure: LEFT HEART CATH AND CORONARY ANGIOGRAPHY;  Surgeon: Martinique, Donnalee Cellucci M, MD;  Location: [facility];   Service: Cardiovascular;  Laterality: N/A;     Current Meds  Medication Sig  . aspirin EC 81 MG tablet Take 81 mg by mouth daily.  Marland Kitchen atorvastatin (LIPITOR) 80 MG tablet TAKE 1 TABLET AT BEDTIME  . cholecalciferol (VITAMIN D3) 25 MCG (1000 UT) tablet Take 1,000 Units by mouth daily.  Marland Kitchen dutasteride (AVODART) 0.5 MG capsule Take 1 capsule (0.5 mg total) by mouth daily.  Marland Kitchen mexiletine (MEXITIL) 250 MG capsule TAKE ONE CAPSULE BY MOUTH TWICE A DAY  . nitroGLYCERIN (NITROSTAT) 0.4 MG SL tablet DISSOLVE 1 TABLET UNDER THE TONGUE EVERY 5 MINUTES AS NEEDED FOR CHEST PAIN  . olmesartan (BENICAR) 40 MG tablet TAKE ONE-HALF (1/2) TABLET DAILY  . prasugrel (EFFIENT) 10 MG TABS tablet TAKE 1 TABLET DAILY  . PRESCRIPTION MEDICATION CPAP: At bedtime  . Probiotic Product (PROBIOTIC DAILY) CAPS Take 1 capsule by mouth daily.  . tamsulosin (FLOMAX) 0.4 MG CAPS capsule Take 0.4 mg by mouth daily.  . [DISCONTINUED] metoprolol succinate (TOPROL-XL) 25 MG 24 hr tablet Take 0.5 tablets (12.5 mg total) by mouth daily.     Allergies:   Plavix [clopidogrel bisulfate]   Social History   Tobacco Use  . Smoking status: Never Smoker  . Smokeless tobacco: Never Used  Vaping Use  . Vaping Use: Never used  Substance Use Topics  . Alcohol use: No  .  Drug use: No     Family Hx: The patient's family history includes CAD in his paternal uncle; Dementia in his mother; Diabetes Mellitus I in his father; Heart disease in his paternal uncle; Hypertension in his father; Leukemia in his maternal uncle; Lung cancer in his father.  ROS:   Please see the history of present illness.    General:no colds or fevers, no weight changes Skin:no further rashes or ulcers HEENT:no blurred vision, no congestion CV:see HPI PUL:see HPI GI:no diarrhea constipation or melena, no indigestion GU:no hematuria, no dysuria MS:no joint pain, no claudication Neuro:no syncope, no lightheadedness Endo:no diabetes, no thyroid disease  All  other systems reviewed and are negative.   Prior CV studies:   The following studies were reviewed today:  cardiac cath 04/24/18  Previously placed Mid RCA stent (unknown type) is widely patent.  Ost 1st Diag to 1st Diag lesion is 100% stenosed.  LV end diastolic pressure is normal.  1. Single vessel occlusive CAD involving a small first diagonal. The stent in the mid RCA is widely patent. Diffuse coronary ectasia 2. Normal LVEDP  Plan: medical management.   Echo 04/24/18 IMPRESSIONS  1. Mild hypokinesis of the left ventricular, mid-apical inferoseptal wall, inferior wall, inferolateral wall and lateral wall. 2. The left ventricle has mildly reduced systolic function, with an ejection fraction of 45-50%. The cavity size was normal. There is moderate concentric left ventricular hypertrophy. Left ventricular diastolic Doppler parameters are consistent with  impaired relaxation. 3. The right ventricle has normal systolic function. The cavity was normal. There is no increase in right ventricular wall thickness. 4. Left atrial size was moderately dilated. 5. Mild thickening of the mitral valve leaflet. Mild calcification of the mitral valve leaflet. 6. The aortic valve is tricuspid Mild thickening of the aortic valve Mild calcification of the aortic valve. Aortic valve regurgitation is trivial by color flow Doppler.  FINDINGS Left Ventricle: The left ventricle has mildly reduced systolic function, with an ejection fraction of 45-50%. The cavity size was normal. There is moderate concentric left ventricular hypertrophy. Left ventricular diastolic Doppler parameters are  consistent with impaired relaxation. Mild hypokinesis of the left ventricular, mid-apical inferoseptal wall, inferior wall, inferolateral wall and lateral wall. Right Ventricle: The right ventricle has normal systolic function. The cavity was normal. There is no increase in right ventricular wall thickness. Left  Atrium: left atrial size was moderately dilated Right Atrium: right atrial size was normal in size. Right atrial pressure is estimated at 3 mmHg. Interatrial Septum: No atrial level shunt detected by color flow Doppler. Pericardium: There is no evidence of pericardial effusion. Mitral Valve: The mitral valve is normal in structure. Mild thickening of the mitral valve leaflet. Mild calcification of the mitral valve leaflet. Mitral valve regurgitation is not visualized by color flow Doppler. Tricuspid Valve: The tricuspid valve is normal in structure. Tricuspid valve regurgitation is trivial by color flow Doppler. Aortic Valve: The aortic valve is tricuspid Mild thickening of the aortic valve Mild calcification of the aortic valve. Aortic valve regurgitation is trivial by color flow Doppler. Pulmonic Valve: The pulmonic valve was grossly normal. Pulmonic valve regurgitation is trivial by color flow Doppler. Venous: The inferior vena cava is normal in size with greater than 50% respiratory variability.  LEFT VENTRICLE PLAX 2D LVIDd: 4.70 cm Diastology LVIDs: 3.20 cm LV e' lateral: 8.38 cm/s LV PW: 1.60 cm LV E/e' lateral: 7.0 LV IVS: 1.70 cm LV e' medial: 4.35 cm/s LVOT diam: 2.30 cm LV E/e'  medial: 13.4 LV SV: 61 ml LV SV Index: 24.70 LVOT Area: 4.15 cm  RIGHT VENTRICLE RVSP: 26.0 mmHg  LEFT ATRIUM Index RIGHT ATRIUM Index LA diam: 5.10 cm 2.14 cm/m RA Pressure: 3 mmHg LA Vol (A2C): 56.3 ml 23.66 ml/m RA Area: 17.30 cm LA Vol (A4C): 99.2 ml 41.68 ml/m RA Volume: 43.30 ml 18.19 ml/m AORTIC VALVE LVOT Vmax: 116.00 cm/s LVOT Vmean: 86.800 cm/s LVOT VTI: 0.267 m  Labs/Other Tests and Data Reviewed:    EKG:  03/10/19 SR rate 30 PvC old IMI   Recent Labs: 11/18/2018: BUN 14; Creatinine, Ser 1.08; Magnesium 1.8; Potassium 4.1; Sodium 140   Recent Lipid Panel Lab  Results  Component Value Date/Time   CHOL 105 07/19/2018 08:33 AM   TRIG 155 (H) 07/19/2018 08:33 AM   HDL 33 (L) 07/19/2018 08:33 AM   CHOLHDL 3.2 07/19/2018 08:33 AM   CHOLHDL 3.7 04/24/2018 06:30 AM   LDLCALC 41 07/19/2018 08:33 AM    Wt Readings from Last 3 Encounters:  08/22/19 247 lb (112 kg)  03/10/19 244 lb 12.8 oz (111 kg)  12/27/18 248 lb 3.8 oz (112.6 kg)     Objective:    Vital Signs:  BP 122/66   Pulse 72   Ht 6' (1.829 m)   Wt 247 lb (112 kg)   SpO2 97%   BMI 33.50 kg/m    Affect appropriate Healthy:  appears stated age HEENT: normal Neck supple with no adenopathy JVP normal no bruits no thyromegaly Lungs clear with no wheezing and good diaphragmatic motion Heart:  S1/S2 no murmur, no rub, gallop or click PMI normal Abdomen: benighn, BS positve, no tenderness, no AAA no bruit.  No HSM or HJR Distal pulses intact with no bruits No edema Neuro non-focal Skin warm and dry No muscular weakness   ASSESSMENT & PLAN:    1. CAD: distant RCA stent patent by last cath 04/24/18 occluded small D1 medical RX   2. HTN: on low side better with d/c imdur and lower dose beta blocker  3. HLD continue statin and check hepatic and lipids last LDL 41 07/19/18  4. Cardiomyopathy : EF better TTE 03/19/19 50-55% 5. PVC:  Asymptomatic burden on monitor 25% 11/15/18 continue low dose beta blocker f/u [place] 6. Tremor - wors will try to change toprol to Inderal LA 80 mg daily to see if this helps   COVID-19 Education: The signs and symptoms of COVID-19 were discussed with the patient and how to seek care for testing (follow up with PCP or arrange E-visit).  The importance of social distancing was discussed today.    Medication Adjustments/Labs and Tests Ordered: Current medicines are reviewed at length with the patient today.  Concerns regarding medicines are outlined above.   Tests Ordered: Orders Placed This Encounter  Procedures  . Hepatic function panel  . Lipid  panel    Medication Changes: Meds ordered this encounter  Medications  . propranolol ER (INDERAL LA) 80 MG 24 hr capsule    Sig: Take 1 capsule (80 mg total) by mouth daily.    Dispense:  90 capsule    Refill:  3   D/C Toprol start LA Inderal 80 mg daily   Disposition:  Follow up [place] 6 months and me in a year   Signed, Jenkins Rouge, MD  08/22/2019 10:19 AM    [facility]

## 2019-08-22 ENCOUNTER — Other Ambulatory Visit: Payer: Self-pay

## 2019-08-22 ENCOUNTER — Encounter: Payer: Self-pay | Admitting: Cardiovascular Disease

## 2019-08-22 ENCOUNTER — Ambulatory Visit (INDEPENDENT_AMBULATORY_CARE_PROVIDER_SITE_OTHER): Payer: Medicare Other | Admitting: Cardiovascular Disease

## 2019-08-22 VITALS — BP 122/66 | HR 72 | Ht 72.0 in | Wt 247.0 lb

## 2019-08-22 DIAGNOSIS — R251 Tremor, unspecified: Secondary | ICD-10-CM

## 2019-08-22 DIAGNOSIS — I2510 Atherosclerotic heart disease of native coronary artery without angina pectoris: Secondary | ICD-10-CM

## 2019-08-22 MED ORDER — PROPRANOLOL HCL ER 80 MG PO CP24
80.0000 mg | ORAL_CAPSULE | Freq: Every day | ORAL | 3 refills | Status: DC
Start: 2019-08-22 — End: 2020-04-22

## 2019-08-22 NOTE — Patient Instructions (Addendum)
Medication Instructions:  Your physician has recommended you make the following change in your medication:   1-STOP metoprolol 2-START Inderal 80 mg by mouth daily.  *If you need a refill on your cardiac medications before your next appointment, please call your pharmacy*  Lab Work: Your physician recommends that you return for lab work on 09/16/19 for fasting lipid and liver panel.  If you have labs (blood work) drawn today and your tests are completely normal, you will receive your results only by: Marland Kitchen MyChart Message (if you have MyChart) OR . A paper copy in the mail If you have any lab test that is abnormal or we need to change your treatment, we will call you to review the results.  Testing/Procedures: None ordered today.  Follow-Up: At [facility], you and your health needs are our priority.  As part of our continuing mission to provide you with exceptional heart care, we have created designated Provider Care Teams.  These Care Teams include your primary Cardiologist (physician) and Advanced Practice Providers (APPs -  Physician Assistants and Nurse Practitioners) who all work together to provide you with the care you need, when you need it.  We recommend signing up for the patient portal called "MyChart".  Sign up information is provided on this After Visit Summary.  MyChart is used to connect with patients for Virtual Visits (Telemedicine).  Patients are able to view lab/test results, encounter notes, upcoming appointments, etc.  Non-urgent messages can be sent to your provider as well.   To learn more about what you can do with MyChart, go to [email or web address].    Your next appointment:   12 month(s)  The format for your next appointment:   In Person  Provider:   You may see Jenkins Rouge, MD or one of the following Advanced Practice Providers on your designated Care Team:    Truitt Merle, NP  Cecilie Kicks, NP  Kathyrn Drown, NP

## 2019-09-14 IMAGING — DX CHEST - 2 VIEW
2 series · 2 of 2 positions shown · non-contrast
Comparison: None.

CLINICAL DATA: Chest pain

EXAM:
PORTABLE CHEST 1 VIEW

[chest pa]
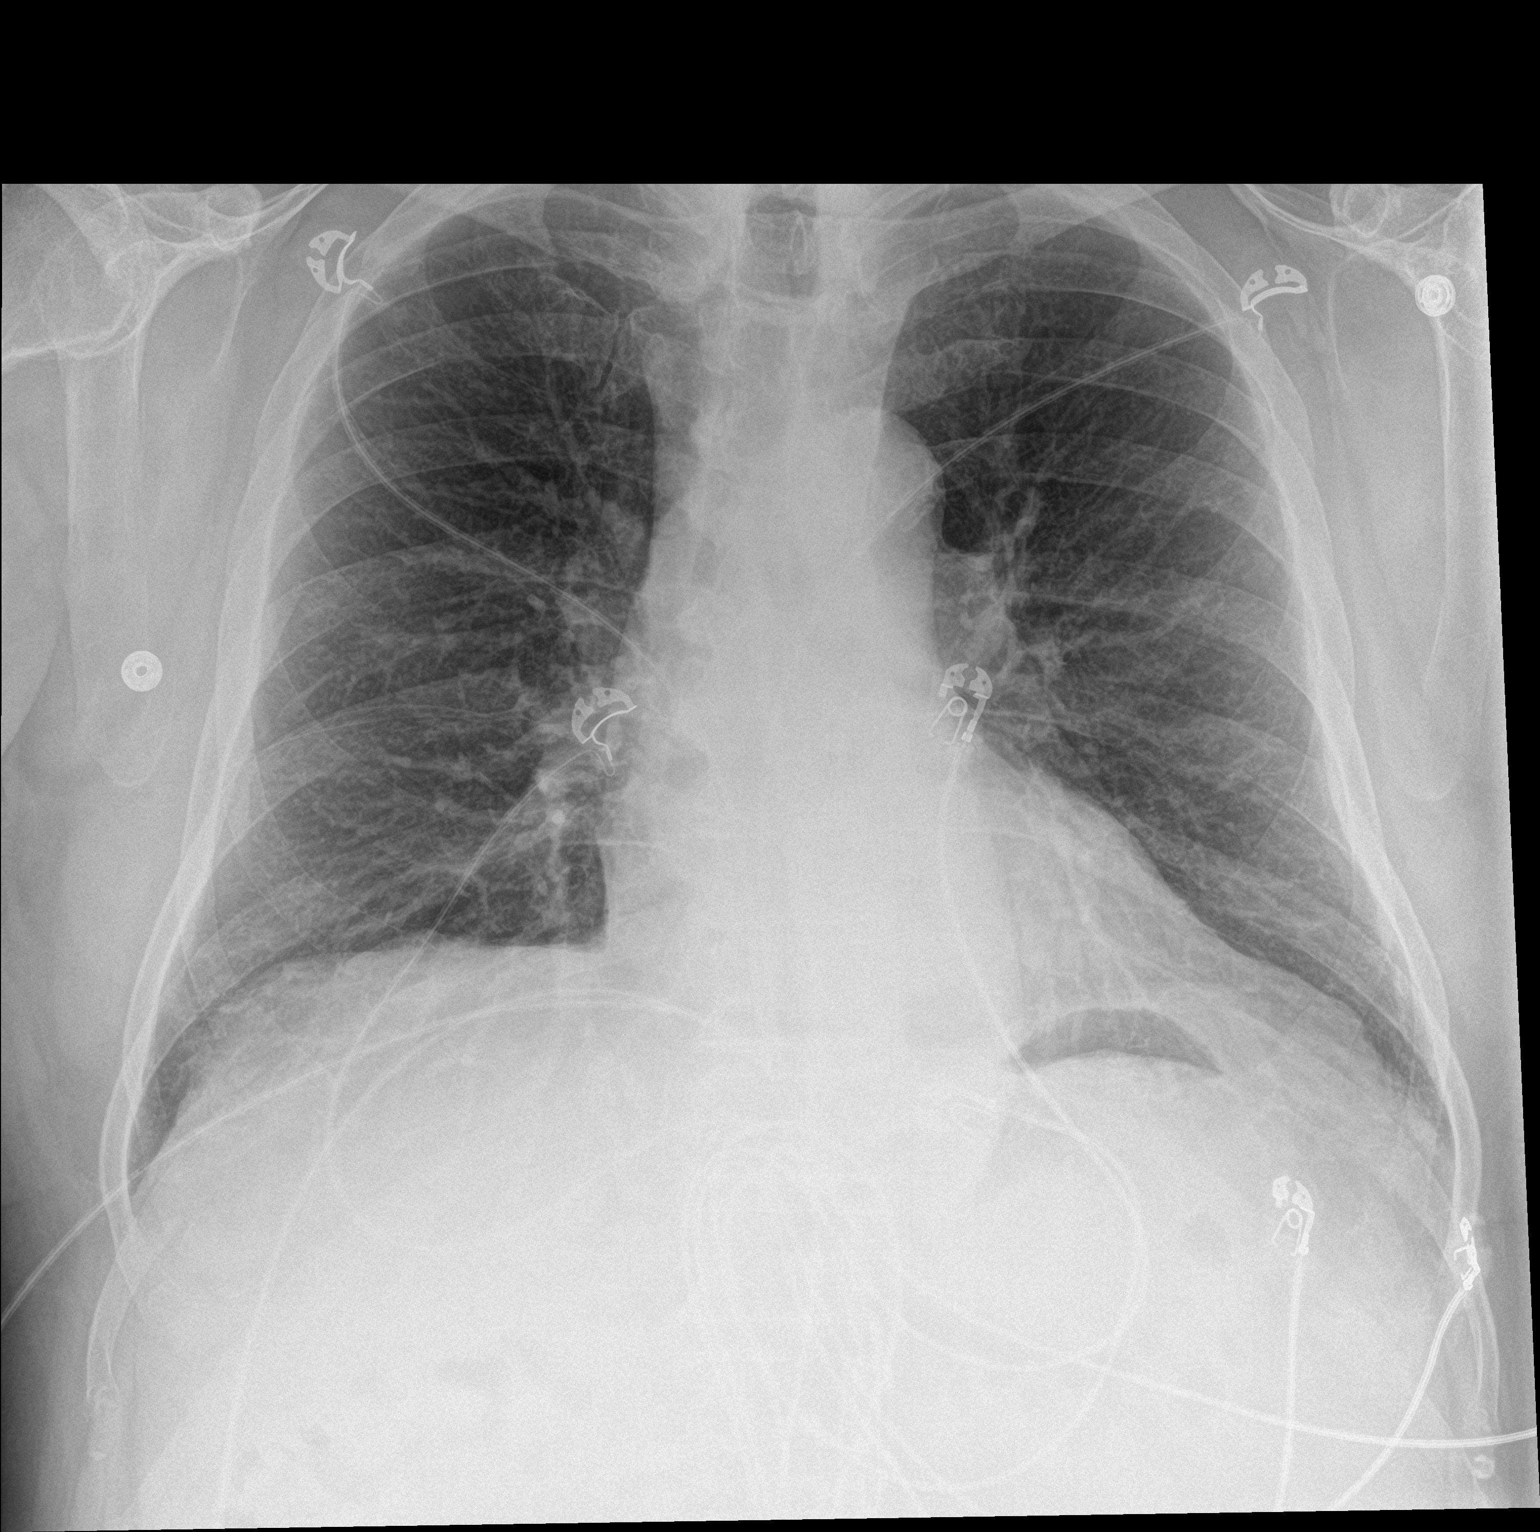

[chest lat]
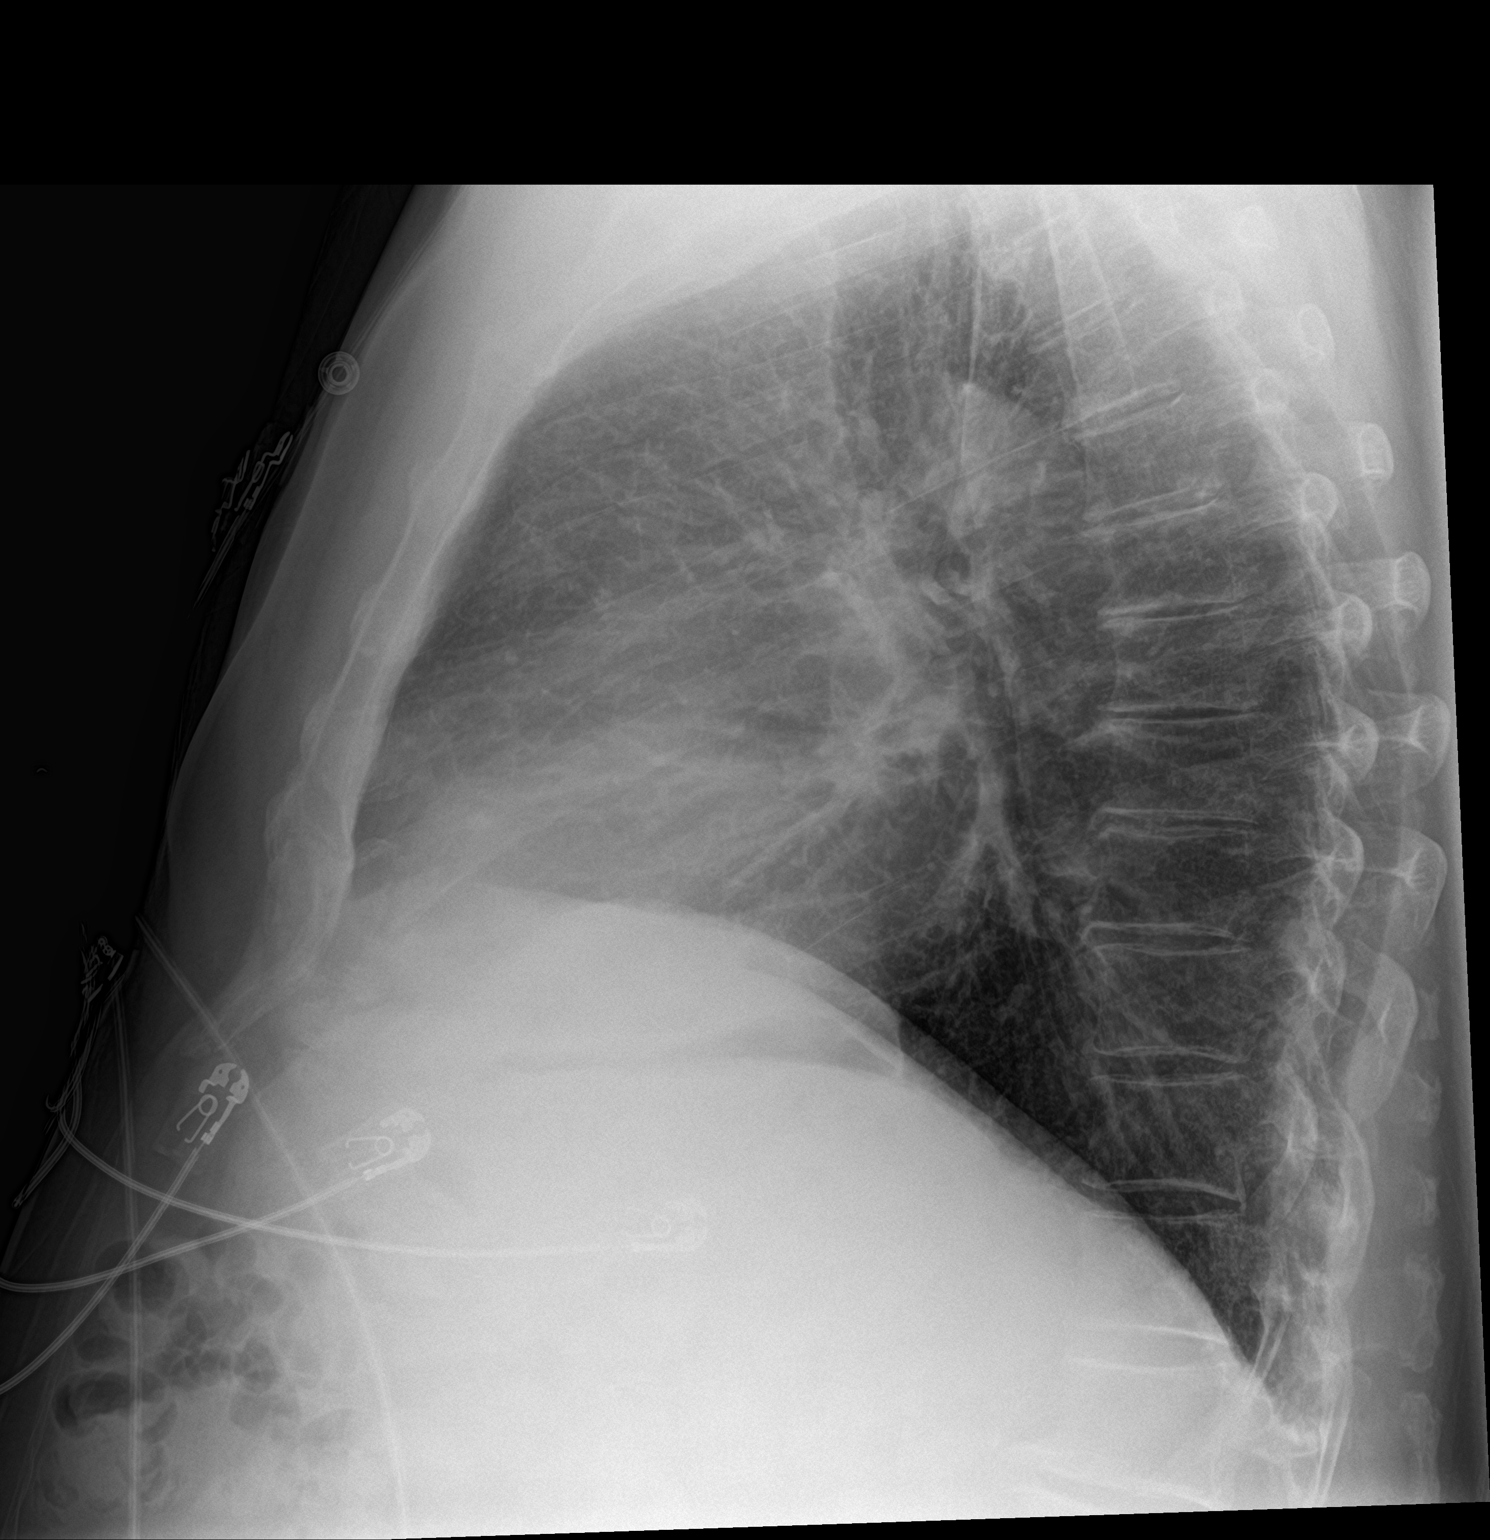

[2 of 2 positions shown; findings below may reference images not displayed]

FINDINGS: The heart size and mediastinal contours are within normal limits.
Both lungs are clear. The visualized skeletal structures are
unremarkable.
IMPRESSION: No active disease.

## 2019-09-16 ENCOUNTER — Ambulatory Visit (INDEPENDENT_AMBULATORY_CARE_PROVIDER_SITE_OTHER): Payer: Medicare Other | Admitting: Cardiology

## 2019-09-16 ENCOUNTER — Encounter: Payer: Self-pay | Admitting: Cardiology

## 2019-09-16 ENCOUNTER — Other Ambulatory Visit: Payer: Self-pay

## 2019-09-16 ENCOUNTER — Other Ambulatory Visit: Payer: Medicare Other

## 2019-09-16 VITALS — BP 116/76 | HR 56 | Ht 72.0 in | Wt 251.0 lb

## 2019-09-16 DIAGNOSIS — I493 Ventricular premature depolarization: Secondary | ICD-10-CM | POA: Insufficient documentation

## 2019-09-16 DIAGNOSIS — I10 Essential (primary) hypertension: Secondary | ICD-10-CM

## 2019-09-16 DIAGNOSIS — E782 Mixed hyperlipidemia: Secondary | ICD-10-CM | POA: Diagnosis not present

## 2019-09-16 DIAGNOSIS — R251 Tremor, unspecified: Secondary | ICD-10-CM

## 2019-09-16 DIAGNOSIS — I2510 Atherosclerotic heart disease of native coronary artery without angina pectoris: Secondary | ICD-10-CM

## 2019-09-16 LAB — HEPATIC FUNCTION PANEL
ALT: 15 IU/L (ref 0–44)
AST: 16 IU/L (ref 0–40)
Albumin: 4.2 g/dL (ref 3.7–4.7)
Alkaline Phosphatase: 74 IU/L (ref 48–121)
Bilirubin Total: 0.7 mg/dL (ref 0.0–1.2)
Bilirubin, Direct: 0.2 mg/dL (ref 0.00–0.40)
Total Protein: 6.2 g/dL (ref 6.0–8.5)

## 2019-09-16 LAB — LIPID PANEL
Chol/HDL Ratio: 3.3 ratio (ref 0.0–5.0)
Cholesterol, Total: 121 mg/dL (ref 100–199)
HDL: 37 mg/dL — ABNORMAL LOW (ref >39)
LDL Chol Calc (NIH): 57 mg/dL (ref 0–99)
Triglycerides: 160 mg/dL — ABNORMAL HIGH (ref 0–149)
VLDL Cholesterol Cal: 27 mg/dL (ref 5–40)

## 2019-09-16 NOTE — Patient Instructions (Addendum)
Medication Instructions:  Your physician recommends that you continue on your current medications as directed. Please refer to the Current Medication list given to you today.  Labwork: You will get lab work today:  LIPIDS AND LIVER PANEL  Testing/Procedures: None ordered.  Follow-Up: Your physician wants you to follow-up in: one year with Dr. Curt Bears or EP TEAM.   You will receive a reminder letter in the mail two months in advance. If you don't receive a letter, please call our office to schedule the follow-up appointment.   Any Other Special Instructions Will Be Listed Below (If Applicable).  If you need a refill on your cardiac medications before your next appointment, please call your pharmacy.

## 2019-09-16 NOTE — Progress Notes (Signed)
Electrophysiology Office Note   Date:  09/16/2019   ID:  Elizer, Bostic 1945/10/20, MRN [id number]  PCP:  Lujean Amel, MD  Cardiologist: Johnsie Cancel Primary Electrophysiologist:  Shaena Parkerson Meredith Leeds, MD    Chief Complaint: PVC   History of Present Illness: Marcus Parsons is a 74 y.o. male who is being seen today for the evaluation of PVC at the request of Jenkins Rouge. Presenting today for electrophysiology evaluation.  He has a history of coronary artery disease status post STEMI in 2015 status post drug-eluting stents to the RCA, hypertension, OSA.  He wore a cardiac monitor that showed approximately 25% PVCs.  Today, denies symptoms of palpitations, chest pain, shortness of breath, orthopnea, PND, lower extremity edema, claudication, dizziness, presyncope, syncope, bleeding, or neurologic sequela. The patient is tolerating medications without difficulties.  Overall he is doing well.  He has no chest pain or shortness of breath.  To all of his daily activities without restriction.  He is unaware of any arrhythmias.  He has no palpitations.     Past Medical History:  Diagnosis Date  . Acute myocardial infarction ([place]) 04/2013   INFERIOR  . Atherosclerosis of coronary artery   . Benign prostatic hypertrophy   . Carotid stenosis   . Hyperlipidemia   . Obstructive sleep apnea   . S/P coronary artery stent placement    Past Surgical History:  Procedure Laterality Date  . HERNIA REPAIR    . KNEE SURGERY     AS CHILD  . LEFT HEART CATH AND CORONARY ANGIOGRAPHY N/A 04/24/2018   Procedure: LEFT HEART CATH AND CORONARY ANGIOGRAPHY;  Surgeon: Martinique, Peter M, MD;  Location: [facility];  Service: Cardiovascular;  Laterality: N/A;     Current Outpatient Medications  Medication Sig Dispense Refill  . aspirin EC 81 MG tablet Take 81 mg by mouth daily.    Marland Kitchen atorvastatin (LIPITOR) 80 MG tablet TAKE 1 TABLET AT BEDTIME 90 tablet 3  . cholecalciferol (VITAMIN D3) 25 MCG (1000 UT)  tablet Take 1,000 Units by mouth daily.    Marland Kitchen dutasteride (AVODART) 0.5 MG capsule Take 1 capsule (0.5 mg total) by mouth daily. 90 capsule 3  . mexiletine (MEXITIL) 250 MG capsule TAKE ONE CAPSULE BY MOUTH TWICE A DAY 180 capsule 2  . nitroGLYCERIN (NITROSTAT) 0.4 MG SL tablet DISSOLVE 1 TABLET UNDER THE TONGUE EVERY 5 MINUTES AS NEEDED FOR CHEST PAIN 25 tablet 6  . olmesartan (BENICAR) 40 MG tablet TAKE ONE-HALF (1/2) TABLET DAILY 45 tablet 3  . prasugrel (EFFIENT) 10 MG TABS tablet TAKE 1 TABLET DAILY 90 tablet 3  . PRESCRIPTION MEDICATION CPAP: At bedtime    . Probiotic Product (PROBIOTIC DAILY) CAPS Take 1 capsule by mouth daily. 90 capsule 3  . propranolol ER (INDERAL LA) 80 MG 24 hr capsule Take 1 capsule (80 mg total) by mouth daily. 90 capsule 3  . tamsulosin (FLOMAX) 0.4 MG CAPS capsule Take 0.4 mg by mouth daily.     No current facility-administered medications for this visit.    Allergies:   Plavix [clopidogrel bisulfate]   Social History:  The patient  reports that he has never smoked. He has never used smokeless tobacco. He reports that he does not drink alcohol and does not use drugs.   Family History:  The patient's family history includes CAD in his paternal uncle; Dementia in his mother; Diabetes Mellitus I in his father; Heart disease in his paternal uncle; Hypertension in his father; Leukemia in  his maternal uncle; Lung cancer in his father.   ROS:  Please see the history of present illness.   Otherwise, review of systems is positive for none.   All other systems are reviewed and negative.   PHYSICAL EXAM: VS:  BP 116/76   Pulse (!) 56   Ht 6' (1.829 m)   Wt 251 lb (113.9 kg)   SpO2 95%   BMI 34.04 kg/m  , BMI Body mass index is 34.04 kg/m. GEN: Well nourished, well developed, in no acute distress  HEENT: normal  Neck: no JVD, carotid bruits, or masses Cardiac: RRR; no murmurs, rubs, or gallops,no edema  Respiratory:  clear to auscultation bilaterally, normal  work of breathing GI: soft, nontender, nondistended, + BS MS: no deformity or atrophy  Skin: warm and dry Neuro:  Strength and sensation are intact Psych: euthymic mood, full affect  EKG:  EKG is ordered today. Personal review of the ekg ordered shows sinus rhythm, rate 55  Recent Labs: 11/18/2018: BUN 14; Creatinine, Ser 1.08; Magnesium 1.8; Potassium 4.1; Sodium 140    Lipid Panel     Component Value Date/Time   CHOL 105 07/19/2018 0833   TRIG 155 (H) 07/19/2018 0833   HDL 33 (L) 07/19/2018 0833   CHOLHDL 3.2 07/19/2018 0833   CHOLHDL 3.7 04/24/2018 0630   VLDL 34 04/24/2018 0630   LDLCALC 41 07/19/2018 0833     Wt Readings from Last 3 Encounters:  09/16/19 251 lb (113.9 kg)  08/22/19 247 lb (112 kg)  03/10/19 244 lb 12.8 oz (111 kg)      Other studies Reviewed: Additional studies/ records that were reviewed today include: [place] 04/24/18  Review of the above records today demonstrates:   Previously placed Mid RCA stent (unknown type) is widely patent.  Ost 1st Diag to 1st Diag lesion is 100% stenosed.  LV end diastolic pressure is normal.   1. Single vessel occlusive CAD involving a small first diagonal. The stent in the mid RCA is widely patent. Diffuse coronary ectasia 2. Normal LVEDP  TTE 03/19/19 1. Left ventricular ejection fraction, by visual estimation, is 50 to  55%. The left ventricle has low normal function. There is no left  ventricular hypertrophy.  2. Left ventricular diastolic parameters are consistent with Grade I  diastolic dysfunction (impaired relaxation).  3. The left ventricle has no regional wall motion abnormalities.  4. Global right ventricle has normal systolic function.The right  ventricular size is normal. No increase in right ventricular wall  thickness.  5. Left atrial size was severely dilated.  6. Right atrial size was normal.  7. The mitral valve is normal in structure. Mild mitral valve  regurgitation. No evidence of  mitral stenosis.  8. The tricuspid valve is normal in structure.  9. The tricuspid valve is normal in structure. Tricuspid valve  regurgitation is not demonstrated.  10. The aortic valve is normal in structure. Aortic valve regurgitation is  trivial. No evidence of aortic valve sclerosis or stenosis.  11. The pulmonic valve was normal in structure. Pulmonic valve  regurgitation is trivial.  12. There is borderline dilatation of the ascending aorta measuring 38 mm.  13. Normal pulmonary artery systolic pressure.  14. The inferior vena cava is normal in size with greater than 50%  respiratory variability, suggesting right atrial pressure of 3 mmHg.   Cardiac monitor 11/28/18 NSR average HR 71 PAC;s/Atrial arrhythmias <1% PVC;s frequent minimal NSVT  PVC;s high burden 24.6% of beats   ASSESSMENT  AND PLAN:  1.  PVCs: High PVC burden of almost currently on mexiletine is minimally symptomatic.  He does not wish to have an ablation at this time.  Fortunately his ECG shows no PVCs.  He is feeling well.  Continue with current management.  2.  Coronary artery disease: Status post RCA stent.  No current chest pain.  3.  Hypertension: Currently well controlled  4.  Hyperlipidemia: Lipitor per primary cardiology  5.  Chronic systolic heart failure due to ischemic cardiomyopathy: Ejection fraction 40 to 45%.  Of MI in the past.  Currently on ARB per primary cardiology.  Current medicines are reviewed at length with the patient today.   The patient does not have concerns regarding his medicines.  The following changes were made today: None  Labs/ tests ordered today include:  Orders Placed This Encounter  Procedures  . EKG 12-Lead     Disposition:   FU with Shishir Krantz 12 months  Signed, Renner Sebald Meredith Leeds, MD  09/16/2019 9:39 AM     [facility] [address] [place] [address] [phone] (office) [phone] (fax)

## 2019-09-22 ENCOUNTER — Telehealth: Payer: Self-pay

## 2019-09-22 DIAGNOSIS — E782 Mixed hyperlipidemia: Secondary | ICD-10-CM

## 2019-09-22 NOTE — Telephone Encounter (Signed)
-----   Message from Josue Hector, MD sent at 09/22/2019 10:05 AM EDT ----- Cholesterol is at goal and LFT's are normal F/U labs in 6 months

## 2019-09-22 NOTE — Telephone Encounter (Signed)
Patient aware of results. Per Dr. Johnsie Cancel, Cholesterol is at goal and LFT's are normal F/U labs in 6 months. Patient will come back on 03/25/19 for fasting lab work.

## 2019-12-17 DIAGNOSIS — Z23 Encounter for immunization: Secondary | ICD-10-CM | POA: Diagnosis not present

## 2019-12-19 ENCOUNTER — Other Ambulatory Visit: Payer: Self-pay | Admitting: Cardiology

## 2019-12-23 DIAGNOSIS — Z23 Encounter for immunization: Secondary | ICD-10-CM | POA: Diagnosis not present

## 2020-01-06 DIAGNOSIS — N400 Enlarged prostate without lower urinary tract symptoms: Secondary | ICD-10-CM | POA: Diagnosis not present

## 2020-01-13 ENCOUNTER — Telehealth: Payer: Self-pay | Admitting: *Deleted

## 2020-01-13 DIAGNOSIS — R350 Frequency of micturition: Secondary | ICD-10-CM | POA: Diagnosis not present

## 2020-01-13 DIAGNOSIS — N401 Enlarged prostate with lower urinary tract symptoms: Secondary | ICD-10-CM | POA: Diagnosis not present

## 2020-01-13 NOTE — Telephone Encounter (Signed)
   [facility] Pre-operative Risk Assessment    HEARTCARE STAFF: - Please ensure there is not already an duplicate clearance open for this procedure. - Under Visit Info/Reason for Call, type in Other and utilize the format Clearance MM/DD/YY or Clearance TBD. Do not use dashes or single digits. - If request is for dental extraction, please clarify the # of teeth to be extracted.  Request for surgical clearance:  1. What type of surgery is being performed? 1 TOOTH EXTRACTION   2. When is this surgery scheduled? TBD   3. What type of clearance is required (medical clearance vs. Pharmacy clearance to hold med vs. Both)? MEDICAL  4. Are there any medications that need to be held prior to surgery and how long? ASA AND EFFIENT IF NEED TO BE HELD?   5. Practice name and name of physician performing surgery? [facility]; DR. Harrell Gave [place]   6. What is the office phone number? [phone]   7.   What is the office fax number? [phone]  8.   Anesthesia type (None, local, MAC, general) ? IV SEDATION   Marcus Parsons 01/13/2020, 4:41 PM  _________________________________________________________________   (provider comments below)

## 2020-01-14 NOTE — Telephone Encounter (Signed)
   Primary Cardiologist: Jenkins Rouge, MD  Chart reviewed as part of pre-operative protocol coverage.   Simple dental extractions (2 or less teeth) are considered low risk procedures per guidelines and generally do not require any specific cardiac clearance. It is also generally accepted that for simple extractions and dental cleanings, there is no need to interrupt blood thinner therapy.   SBE prophylaxis is not required for the patient from a cardiac standpoint.  I will route this recommendation to the requesting party via Epic fax function and remove from pre-op pool.  Please call with questions.  Tami Lin Bliss Tsang, PA 01/14/2020, 7:38 AM

## 2020-01-21 ENCOUNTER — Telehealth: Payer: Self-pay | Admitting: Cardiovascular Disease

## 2020-01-21 DIAGNOSIS — I6521 Occlusion and stenosis of right carotid artery: Secondary | ICD-10-CM

## 2020-01-21 DIAGNOSIS — E782 Mixed hyperlipidemia: Secondary | ICD-10-CM

## 2020-01-21 NOTE — Telephone Encounter (Signed)
Patient stated he had a CT scan done by his dentist and it showed carotid calcification. Patient stated that his dentist will be faxing over the results. Patient was told to contact his cardiologist. Will forward to Dr. Johnsie Cancel for advisement.

## 2020-01-21 NOTE — Telephone Encounter (Signed)
New message:     Patient calling stating he went to the dentis and they requested that he follow up with his doctor. Patient had a xray. Please call patient back.

## 2020-01-22 NOTE — Telephone Encounter (Signed)
Can order carotid duplex

## 2020-01-22 NOTE — Telephone Encounter (Signed)
Placed order for carotid duplex. Called patient to let him know someone will be calling him to schedule.

## 2020-01-23 ENCOUNTER — Other Ambulatory Visit: Payer: Self-pay

## 2020-01-23 ENCOUNTER — Ambulatory Visit (HOSPITAL_COMMUNITY)
Admission: RE | Admit: 2020-01-23 | Discharge: 2020-01-23 | Disposition: A | Discharge status: Home / Self Care | Payer: Medicare Other | Source: Ambulatory Visit | Attending: Cardiovascular Disease | Admitting: Cardiovascular Disease

## 2020-01-23 DIAGNOSIS — I6521 Occlusion and stenosis of right carotid artery: Secondary | ICD-10-CM | POA: Insufficient documentation

## 2020-01-23 DIAGNOSIS — E782 Mixed hyperlipidemia: Secondary | ICD-10-CM | POA: Insufficient documentation

## 2020-01-23 NOTE — Telephone Encounter (Signed)
Patient called and said his Dentist has not gotten the clearance results. Please re-send clearance to the patient's Dentist

## 2020-01-23 NOTE — Telephone Encounter (Signed)
Refaxed form below to requesting office.   Darreld Mclean, PA-C 01/23/2020 12:34 PM

## 2020-02-04 ENCOUNTER — Telehealth: Payer: Self-pay | Admitting: Cardiovascular Disease

## 2020-02-04 DIAGNOSIS — R7309 Other abnormal glucose: Secondary | ICD-10-CM | POA: Diagnosis not present

## 2020-02-04 DIAGNOSIS — Z79899 Other long term (current) drug therapy: Secondary | ICD-10-CM | POA: Diagnosis not present

## 2020-02-04 DIAGNOSIS — N401 Enlarged prostate with lower urinary tract symptoms: Secondary | ICD-10-CM | POA: Diagnosis not present

## 2020-02-04 DIAGNOSIS — I2510 Atherosclerotic heart disease of native coronary artery without angina pectoris: Secondary | ICD-10-CM | POA: Diagnosis not present

## 2020-02-04 DIAGNOSIS — E7800 Pure hypercholesterolemia, unspecified: Secondary | ICD-10-CM | POA: Diagnosis not present

## 2020-02-04 DIAGNOSIS — I10 Essential (primary) hypertension: Secondary | ICD-10-CM | POA: Diagnosis not present

## 2020-02-04 DIAGNOSIS — Z0001 Encounter for general adult medical examination with abnormal findings: Secondary | ICD-10-CM | POA: Diagnosis not present

## 2020-02-04 NOTE — Telephone Encounter (Signed)
Patient is calling to see why his clearance was not approved and wants to know if Dr. Johnsie Cancel or his nurse can call him about this matter. Patient would like to be called back on [phone]. Please advise.

## 2020-02-04 NOTE — Telephone Encounter (Signed)
Dr. Dorian Heckle office called back to let our office know that patient does not need any additional information from our office and they will be calling patient and scheduling him for surgery. Called patient back to let him know what was happening and that Dr. Dorian Heckle office should be calling him. Patient thanked [place] for the call.

## 2020-02-04 NOTE — Telephone Encounter (Signed)
Called patient about his message. Patient stated Dr. Buelah Manis needed more information before he could do the surgery. Called Dr. Dorian Heckle office to see what else they needed. Left message to have their office call us back with Dr. Dorian Heckle request, if further information is needed.

## 2020-03-18 ENCOUNTER — Other Ambulatory Visit: Payer: Self-pay | Admitting: Cardiology

## 2020-03-24 ENCOUNTER — Other Ambulatory Visit: Payer: Medicare Other

## 2020-03-24 ENCOUNTER — Other Ambulatory Visit: Payer: Self-pay

## 2020-03-24 DIAGNOSIS — E782 Mixed hyperlipidemia: Secondary | ICD-10-CM | POA: Diagnosis not present

## 2020-03-25 LAB — LIPID PANEL
Chol/HDL Ratio: 3.3 ratio (ref 0.0–5.0)
Cholesterol, Total: 106 mg/dL (ref 100–199)
HDL: 32 mg/dL — ABNORMAL LOW (ref >39)
LDL Chol Calc (NIH): 50 mg/dL (ref 0–99)
Triglycerides: 135 mg/dL (ref 0–149)
VLDL Cholesterol Cal: 24 mg/dL (ref 5–40)

## 2020-03-25 LAB — HEPATIC FUNCTION PANEL
ALT: 22 IU/L (ref 0–44)
AST: 19 IU/L (ref 0–40)
Albumin: 4 g/dL (ref 3.7–4.7)
Alkaline Phosphatase: 95 IU/L (ref 44–121)
Bilirubin Total: 0.6 mg/dL (ref 0.0–1.2)
Bilirubin, Direct: 0.18 mg/dL (ref 0.00–0.40)
Total Protein: 6.1 g/dL (ref 6.0–8.5)

## 2020-04-14 DIAGNOSIS — G4733 Obstructive sleep apnea (adult) (pediatric): Secondary | ICD-10-CM | POA: Diagnosis not present

## 2020-04-21 ENCOUNTER — Other Ambulatory Visit: Payer: Self-pay

## 2020-04-21 DIAGNOSIS — Z79899 Other long term (current) drug therapy: Secondary | ICD-10-CM

## 2020-04-21 DIAGNOSIS — I493 Ventricular premature depolarization: Secondary | ICD-10-CM

## 2020-04-21 MED ORDER — MEXILETINE HCL 250 MG PO CAPS: 250.0000 mg | ORAL_CAPSULE | Freq: Two times a day (BID) | ORAL | 1 refills | Status: DC

## 2020-04-22 ENCOUNTER — Other Ambulatory Visit: Payer: Self-pay

## 2020-04-22 MED ORDER — PROPRANOLOL HCL ER 80 MG PO CP24
80.0000 mg | ORAL_CAPSULE | Freq: Every day | ORAL | 1 refills | Status: DC
Start: 2020-04-22 — End: 2020-10-04

## 2020-06-15 DIAGNOSIS — H2513 Age-related nuclear cataract, bilateral: Secondary | ICD-10-CM | POA: Diagnosis not present

## 2020-07-07 DIAGNOSIS — H43812 Vitreous degeneration, left eye: Secondary | ICD-10-CM | POA: Diagnosis not present

## 2020-07-07 DIAGNOSIS — H52222 Regular astigmatism, left eye: Secondary | ICD-10-CM | POA: Diagnosis not present

## 2020-07-07 DIAGNOSIS — H2512 Age-related nuclear cataract, left eye: Secondary | ICD-10-CM | POA: Diagnosis not present

## 2020-07-07 DIAGNOSIS — Z01818 Encounter for other preprocedural examination: Secondary | ICD-10-CM | POA: Diagnosis not present

## 2020-07-07 DIAGNOSIS — H2511 Age-related nuclear cataract, right eye: Secondary | ICD-10-CM | POA: Diagnosis not present

## 2020-07-19 DIAGNOSIS — H2512 Age-related nuclear cataract, left eye: Secondary | ICD-10-CM | POA: Diagnosis not present

## 2020-07-19 DIAGNOSIS — H52202 Unspecified astigmatism, left eye: Secondary | ICD-10-CM | POA: Diagnosis not present

## 2020-08-05 DIAGNOSIS — R7309 Other abnormal glucose: Secondary | ICD-10-CM | POA: Diagnosis not present

## 2020-08-06 DIAGNOSIS — H2511 Age-related nuclear cataract, right eye: Secondary | ICD-10-CM | POA: Diagnosis not present

## 2020-08-06 DIAGNOSIS — H52201 Unspecified astigmatism, right eye: Secondary | ICD-10-CM | POA: Diagnosis not present

## 2020-08-17 DIAGNOSIS — Z20822 Contact with and (suspected) exposure to covid-19: Secondary | ICD-10-CM | POA: Diagnosis not present

## 2020-09-08 ENCOUNTER — Other Ambulatory Visit: Payer: Self-pay | Admitting: Cardiology

## 2020-09-08 DIAGNOSIS — Z79899 Other long term (current) drug therapy: Secondary | ICD-10-CM

## 2020-09-08 DIAGNOSIS — I493 Ventricular premature depolarization: Secondary | ICD-10-CM

## 2020-09-09 NOTE — Progress Notes (Signed)
Date:  09/15/2020   ID:  Marcus, Parsons 17-Jun-1945, MRN [id number]  PCP:  Lujean Amel, MD  Cardiologist:  Jenkins Rouge, MD  Electrophysiologist:  Constance Haw, MD   Evaluation Performed:  Follow-Up Visit  Chief Complaint:  CAD  History of Present Illness:    Marcus Parsons is a 75 y.o. male with a hx of CAD s/p STEMI in 2015 (DES to [facility]), as well as HTN, OSA Angina in March 2020 He underwent cardiac cath 04/24/18 and patent stent in RCA but new 100% stenosed 1st diag.  Unable to intervene -medical therapy recommended.  Echo 03/19/19 with EF 50-55% 03/19/19     Pt developed rash after discharge and plavix stopped and effient added, now resolved and no issues with the Effient. Now off imdur that was prescribed in hospital   Tremors in hands now on Inderal LA 80 mg daily   December 2021 dentist noticed calcified carotids f/u duplex 01/23/20 plaque No stenosis   Seen by Dr Curt Bears 09/16/19 for high PVC burden 25% felt best to stay on mexitil   He has some mild vertigo like symptoms   Finished building a tree house for his 53 yo grandson who lives in [place]     The patient does not have symptoms concerning for COVID-19 infection (fever, chills, cough, or new shortness of breath).  Has had vaccine   Past Medical History:  Diagnosis Date   Acute myocardial infarction ([place]) 04/2013   INFERIOR   Atherosclerosis of coronary artery    Benign prostatic hypertrophy    Carotid stenosis    Hyperlipidemia    Obstructive sleep apnea    S/P coronary artery stent placement    Past Surgical History:  Procedure Laterality Date   HERNIA REPAIR     KNEE SURGERY     AS CHILD   LEFT HEART CATH AND CORONARY ANGIOGRAPHY N/A 04/24/2018   Procedure: LEFT HEART CATH AND CORONARY ANGIOGRAPHY;  Surgeon: Martinique, Aldyn Toon M, MD;  Location: [facility] [place];  Service: Cardiovascular;  Laterality: N/A;     Current Meds  Medication Sig   aspirin EC 81 MG tablet Take 81 mg by mouth daily.    atorvastatin (LIPITOR) 80 MG tablet TAKE 1 TABLET AT BEDTIME   cholecalciferol (VITAMIN D3) 25 MCG (1000 UT) tablet Take 1,000 Units by mouth daily.   dutasteride (AVODART) 0.5 MG capsule Take 1 capsule (0.5 mg total) by mouth daily.   mexiletine (MEXITIL) 250 MG capsule TAKE 1 CAPSULE TWICE A DAY   nitroGLYCERIN (NITROSTAT) 0.4 MG SL tablet DISSOLVE 1 TABLET UNDER THE TONGUE EVERY 5 MINUTES AS NEEDED FOR CHEST PAIN   olmesartan (BENICAR) 40 MG tablet TAKE ONE-HALF (1/2) TABLET DAILY   prasugrel (EFFIENT) 10 MG TABS tablet TAKE 1 TABLET DAILY   PRESCRIPTION MEDICATION CPAP: At bedtime   Probiotic Product (PROBIOTIC DAILY) CAPS Take 1 capsule by mouth daily.   propranolol ER (INDERAL LA) 80 MG 24 hr capsule Take 1 capsule (80 mg total) by mouth daily. Please make yearly appt with Dr. Johnsie Cancel for July 2022 for future refills. Thank you 1st attempt   tamsulosin (FLOMAX) 0.4 MG CAPS capsule Take 0.4 mg by mouth daily.     Allergies:   Plavix [clopidogrel bisulfate]   Social History   Tobacco Use   Smoking status: Never   Smokeless tobacco: Never  Vaping Use   Vaping Use: Never used  Substance Use Topics   Alcohol use: No   Drug  use: No     Family Hx: The patient's family history includes CAD in his paternal uncle; Dementia in his mother; Diabetes Mellitus I in his father; Heart disease in his paternal uncle; Hypertension in his father; Leukemia in his maternal uncle; Lung cancer in his father.  ROS:   Please see the history of present illness.    General:no colds or fevers, no weight changes Skin:no further rashes or ulcers HEENT:no blurred vision, no congestion CV:see HPI PUL:see HPI GI:no diarrhea constipation or melena, no indigestion GU:no hematuria, no dysuria MS:no joint pain, no claudication Neuro:no syncope, no lightheadedness Endo:no diabetes, no thyroid disease  All other systems reviewed and are negative.   Prior CV studies:   The following studies were  reviewed today:  cardiac cath 04/24/18 Previously placed Mid RCA stent (unknown type) is widely patent. Ost 1st Diag to 1st Diag lesion is 100% stenosed. LV end diastolic pressure is normal.   1. Single vessel occlusive CAD involving a small first diagonal. The stent in the mid RCA is widely patent. Diffuse coronary ectasia 2. Normal LVEDP   Plan: medical management.    Echo 04/24/18 IMPRESSIONS    1. Mild hypokinesis of the left ventricular, mid-apical inferoseptal wall, inferior wall, inferolateral wall and lateral wall.  2. The left ventricle has mildly reduced systolic function, with an ejection fraction of 45-50%. The cavity size was normal. There is moderate concentric left ventricular hypertrophy. Left ventricular diastolic Doppler parameters are consistent with  impaired relaxation.  3. The right ventricle has normal systolic function. The cavity was normal. There is no increase in right ventricular wall thickness.  4. Left atrial size was moderately dilated.  5. Mild thickening of the mitral valve leaflet. Mild calcification of the mitral valve leaflet.  6. The aortic valve is tricuspid Mild thickening of the aortic valve Mild calcification of the aortic valve. Aortic valve regurgitation is trivial by color flow Doppler.   FINDINGS  Left Ventricle: The left ventricle has mildly reduced systolic function, with an ejection fraction of 45-50%. The cavity size was normal. There is moderate concentric left ventricular hypertrophy. Left ventricular diastolic Doppler parameters are  consistent with impaired relaxation. Mild hypokinesis of the left ventricular, mid-apical inferoseptal wall, inferior wall, inferolateral wall and lateral wall. Right Ventricle: The right ventricle has normal systolic function. The cavity was normal. There is no increase in right ventricular wall thickness. Left Atrium: left atrial size was moderately dilated Right Atrium: right atrial size was normal in size.  Right atrial pressure is estimated at 3 mmHg. Interatrial Septum: No atrial level shunt detected by color flow Doppler. Pericardium: There is no evidence of pericardial effusion. Mitral Valve: The mitral valve is normal in structure. Mild thickening of the mitral valve leaflet. Mild calcification of the mitral valve leaflet. Mitral valve regurgitation is not visualized by color flow Doppler. Tricuspid Valve: The tricuspid valve is normal in structure. Tricuspid valve regurgitation is trivial by color flow Doppler. Aortic Valve: The aortic valve is tricuspid Mild thickening of the aortic valve Mild calcification of the aortic valve. Aortic valve regurgitation is trivial by color flow Doppler. Pulmonic Valve: The pulmonic valve was grossly normal. Pulmonic valve regurgitation is trivial by color flow Doppler. Venous: The inferior vena cava is normal in size with greater than 50% respiratory variability.   LEFT VENTRICLE PLAX 2D LVIDd:         4.70 cm  Diastology LVIDs:         3.20 cm  LV  e' lateral:   8.38 cm/s LV PW:         1.60 cm  LV E/e' lateral: 7.0 LV IVS:        1.70 cm  LV e' medial:    4.35 cm/s LVOT diam:     2.30 cm  LV E/e' medial:  13.4 LV SV:         61 ml LV SV Index:   24.70 LVOT Area:     4.15 cm   RIGHT VENTRICLE RVSP:           26.0 mmHg   LEFT ATRIUM           Index       RIGHT ATRIUM           Index LA diam:      5.10 cm 2.14 cm/m  RA Pressure: 3 mmHg LA Vol (A2C): 56.3 ml 23.66 ml/m RA Area:     17.30 cm LA Vol (A4C): 99.2 ml 41.68 ml/m RA Volume:   43.30 ml  18.19 ml/m  AORTIC VALVE LVOT Vmax:   116.00 cm/s LVOT Vmean:  86.800 cm/s LVOT VTI:    0.267 m  Labs/Other Tests and Data Reviewed:    EKG:  03/10/19 SR rate 80 PvC old IMI 09/15/2020 SR rate 60 normal   Recent Labs: 03/24/2020: ALT 22   Recent Lipid Panel Lab Results  Component Value Date/Time   CHOL 106 03/24/2020 08:53 AM   TRIG 135 03/24/2020 08:53 AM   HDL 32 (L) 03/24/2020 08:53 AM    CHOLHDL 3.3 03/24/2020 08:53 AM   CHOLHDL 3.7 04/24/2018 06:30 AM   LDLCALC 50 03/24/2020 08:53 AM    Wt Readings from Last 3 Encounters:  09/15/20 113.4 kg  09/16/19 113.9 kg  08/22/19 112 kg     Objective:    Vital Signs:  BP 112/62   Pulse 60   Ht '6\' 1"'$  (1.854 m)   Wt 113.4 kg   SpO2 96%   BMI 32.98 kg/m    Affect appropriate Healthy:  appears stated age HEENT: normal Neck supple with no adenopathy JVP normal no bruits no thyromegaly Lungs clear with no wheezing and good diaphragmatic motion Heart:  S1/S2 no murmur, no rub, gallop or click PMI normal Abdomen: benighn, BS positve, no tenderness, no AAA no bruit.  No HSM or HJR Distal pulses intact with no bruits No edema Neuro non-focal Skin warm and dry No muscular weakness   ASSESSMENT & PLAN:    CAD: distant RCA stent patent by last cath 04/24/18 occluded small D1 medical RX   HTN: on low side better with d/c imdur and lower dose beta blocker  HLD continue statin and check hepatic and lipids last LDL 41 07/19/18  Cardiomyopathy : EF better TTE 03/19/19 50-55% PVC:  Asymptomatic burden on monitor 25% 11/15/18 continue low dose beta blocker  and mexitil f/u [place] Tremor - Now on Inderal LA 80 mg rather than Toprol   COVID-19 Education: The signs and symptoms of COVID-19 were discussed with the patient and how to seek care for testing (follow up with PCP or arrange E-visit).  The importance of social distancing was discussed today.    Medication Adjustments/Labs and Tests Ordered: Current medicines are reviewed at length with the patient today.  Concerns regarding medicines are outlined above.   Tests Ordered: No orders of the defined types were placed in this encounter.   Medication Changes: No orders of the defined types were placed in  this encounter.   Disposition:  Follow up [place] 6 months and me in a year   Signed, Jenkins Rouge, MD  09/15/2020 8:46 AM    [place]

## 2020-09-14 ENCOUNTER — Other Ambulatory Visit: Payer: Self-pay | Admitting: Cardiology

## 2020-09-15 ENCOUNTER — Encounter: Payer: Self-pay | Admitting: Cardiovascular Disease

## 2020-09-15 ENCOUNTER — Other Ambulatory Visit: Payer: Self-pay

## 2020-09-15 ENCOUNTER — Ambulatory Visit (INDEPENDENT_AMBULATORY_CARE_PROVIDER_SITE_OTHER): Payer: Medicare Other | Admitting: Cardiovascular Disease

## 2020-09-15 VITALS — BP 112/62 | HR 60 | Ht 73.0 in | Wt 250.0 lb

## 2020-09-15 DIAGNOSIS — I493 Ventricular premature depolarization: Secondary | ICD-10-CM | POA: Diagnosis not present

## 2020-09-15 DIAGNOSIS — I2510 Atherosclerotic heart disease of native coronary artery without angina pectoris: Secondary | ICD-10-CM | POA: Diagnosis not present

## 2020-09-15 NOTE — Patient Instructions (Addendum)
Medication Instructions:  *If you need a refill on your cardiac medications before your next appointment, please call your pharmacy*  Lab Work: If you have labs (blood work) drawn today and your tests are completely normal, you will receive your results only by: MyChart Message (if you have MyChart) OR A paper copy in the mail If you have any lab test that is abnormal or we need to change your treatment, we will call you to review the results.  Testing/Procedures: None ordered today.  Follow-Up: At [facility], you and your health needs are our priority.  As part of our continuing mission to provide you with exceptional heart care, we have created designated Provider Care Teams.  These Care Teams include your primary Cardiologist (physician) and Advanced Practice Providers (APPs -  Physician Assistants and Nurse Practitioners) who all work together to provide you with the care you need, when you need it.  We recommend signing up for the patient portal called "MyChart".  Sign up information is provided on this After Visit Summary.  MyChart is used to connect with patients for Virtual Visits (Telemedicine).  Patients are able to view lab/test results, encounter notes, upcoming appointments, etc.  Non-urgent messages can be sent to your provider as well.   To learn more about what you can do with MyChart, go to [email or web address].com.    Your next appointment:   12 month(s)  The format for your next appointment:   In Person  Provider:   You may see Peter Nishan, MD or one of the following Advanced Practice Providers on your designated Care Team:   Laura Ingold, NP  

## 2020-10-04 ENCOUNTER — Other Ambulatory Visit: Payer: Self-pay | Admitting: Cardiovascular Disease

## 2020-11-16 DIAGNOSIS — L821 Other seborrheic keratosis: Secondary | ICD-10-CM | POA: Diagnosis not present

## 2020-11-16 DIAGNOSIS — L812 Freckles: Secondary | ICD-10-CM | POA: Diagnosis not present

## 2020-11-16 DIAGNOSIS — D225 Melanocytic nevi of trunk: Secondary | ICD-10-CM | POA: Diagnosis not present

## 2020-11-16 DIAGNOSIS — L819 Disorder of pigmentation, unspecified: Secondary | ICD-10-CM | POA: Diagnosis not present

## 2020-11-16 DIAGNOSIS — L814 Other melanin hyperpigmentation: Secondary | ICD-10-CM | POA: Diagnosis not present

## 2020-11-16 DIAGNOSIS — Z23 Encounter for immunization: Secondary | ICD-10-CM | POA: Diagnosis not present

## 2020-11-25 ENCOUNTER — Ambulatory Visit: Payer: Medicare Other | Admitting: Student

## 2020-12-13 ENCOUNTER — Other Ambulatory Visit: Payer: Self-pay | Admitting: Cardiology

## 2020-12-28 DIAGNOSIS — R972 Elevated prostate specific antigen [PSA]: Secondary | ICD-10-CM | POA: Diagnosis not present

## 2021-01-04 DIAGNOSIS — N401 Enlarged prostate with lower urinary tract symptoms: Secondary | ICD-10-CM | POA: Diagnosis not present

## 2021-01-04 DIAGNOSIS — R350 Frequency of micturition: Secondary | ICD-10-CM | POA: Diagnosis not present

## 2021-02-11 DIAGNOSIS — I429 Cardiomyopathy, unspecified: Secondary | ICD-10-CM | POA: Diagnosis not present

## 2021-02-11 DIAGNOSIS — Z79899 Other long term (current) drug therapy: Secondary | ICD-10-CM | POA: Diagnosis not present

## 2021-02-11 DIAGNOSIS — I2510 Atherosclerotic heart disease of native coronary artery without angina pectoris: Secondary | ICD-10-CM | POA: Diagnosis not present

## 2021-02-11 DIAGNOSIS — I10 Essential (primary) hypertension: Secondary | ICD-10-CM | POA: Diagnosis not present

## 2021-02-11 DIAGNOSIS — R7303 Prediabetes: Secondary | ICD-10-CM | POA: Diagnosis not present

## 2021-02-11 DIAGNOSIS — Z0001 Encounter for general adult medical examination with abnormal findings: Secondary | ICD-10-CM | POA: Diagnosis not present

## 2021-02-11 DIAGNOSIS — E7800 Pure hypercholesterolemia, unspecified: Secondary | ICD-10-CM | POA: Diagnosis not present

## 2021-02-20 DIAGNOSIS — Z20822 Contact with and (suspected) exposure to covid-19: Secondary | ICD-10-CM | POA: Diagnosis not present

## 2021-04-01 ENCOUNTER — Other Ambulatory Visit: Payer: Self-pay

## 2021-04-01 ENCOUNTER — Ambulatory Visit (INDEPENDENT_AMBULATORY_CARE_PROVIDER_SITE_OTHER): Payer: Medicare Other | Admitting: Student

## 2021-04-01 VITALS — BP 114/70 | HR 66 | Ht 72.0 in | Wt 256.6 lb

## 2021-04-01 DIAGNOSIS — I10 Essential (primary) hypertension: Secondary | ICD-10-CM | POA: Diagnosis not present

## 2021-04-01 DIAGNOSIS — I493 Ventricular premature depolarization: Secondary | ICD-10-CM

## 2021-04-01 DIAGNOSIS — E782 Mixed hyperlipidemia: Secondary | ICD-10-CM | POA: Diagnosis not present

## 2021-04-01 DIAGNOSIS — I2510 Atherosclerotic heart disease of native coronary artery without angina pectoris: Secondary | ICD-10-CM | POA: Diagnosis not present

## 2021-04-01 NOTE — Addendum Note (Signed)
Addended by: Carylon Perches on: 04/01/2021 04:12 PM   Modules accepted: Orders

## 2021-04-01 NOTE — Progress Notes (Signed)
PCP:  Lujean Amel, MD Primary Cardiologist: Jenkins Rouge, MD Electrophysiologist: Will Meredith Leeds, MD   Marcus Parsons is a 76 y.o. male seen today for Will Meredith Leeds, MD for routine electrophysiology followup.  Since last being seen in our clinic the patient reports doing very well.  he denies chest pain, palpitations, dyspnea, PND, orthopnea, nausea, vomiting, dizziness, syncope, edema, weight gain, or early satiety.  Past Medical History:  Diagnosis Date   Acute myocardial infarction ([place]) 04/2013   INFERIOR   Atherosclerosis of coronary artery    Benign prostatic hypertrophy    Carotid stenosis    Hyperlipidemia    Obstructive sleep apnea    S/P coronary artery stent placement    Past Surgical History:  Procedure Laterality Date   HERNIA REPAIR     KNEE SURGERY     AS CHILD   LEFT HEART CATH AND CORONARY ANGIOGRAPHY N/A 04/24/2018   Procedure: LEFT HEART CATH AND CORONARY ANGIOGRAPHY;  Surgeon: Martinique, Peter M, MD;  Location: [facility];  Service: Cardiovascular;  Laterality: N/A;    Current Outpatient Medications  Medication Sig Dispense Refill   aspirin EC 81 MG tablet Take 81 mg by mouth daily.     atorvastatin (LIPITOR) 80 MG tablet TAKE 1 TABLET AT BEDTIME 90 tablet 3   cholecalciferol (VITAMIN D3) 25 MCG (1000 UT) tablet Take 1,000 Units by mouth daily.     dutasteride (AVODART) 0.5 MG capsule Take 1 capsule (0.5 mg total) by mouth daily. 90 capsule 3   mexiletine (MEXITIL) 250 MG capsule TAKE 1 CAPSULE TWICE A DAY 180 capsule 3   nitroGLYCERIN (NITROSTAT) 0.4 MG SL tablet DISSOLVE 1 TABLET UNDER THE TONGUE EVERY 5 MINUTES AS NEEDED FOR CHEST PAIN 25 tablet 6   olmesartan (BENICAR) 40 MG tablet TAKE ONE-HALF (1/2) TABLET DAILY 135 tablet 3   prasugrel (EFFIENT) 10 MG TABS tablet TAKE 1 TABLET DAILY 90 tablet 3   PRESCRIPTION MEDICATION CPAP: At bedtime     Probiotic Product (PROBIOTIC DAILY) CAPS Take 1 capsule by mouth daily. 90 capsule 3    propranolol ER (INDERAL LA) 80 MG 24 hr capsule Take 1 capsule (80 mg total) by mouth daily. 90 capsule 3   tamsulosin (FLOMAX) 0.4 MG CAPS capsule Take 0.4 mg by mouth daily.     No current facility-administered medications for this visit.    Allergies  Allergen Reactions   Plavix [Clopidogrel Bisulfate] Rash    Social History   Socioeconomic History   Marital status: Married    Spouse name: JANET   Number of children: 1   Years of education: COLLEGE   Highest education level: Not on file  Occupational History   Occupation: RETIRED  Tobacco Use   Smoking status: Never   Smokeless tobacco: Never  Vaping Use   Vaping Use: Never used  Substance and Sexual Activity   Alcohol use: No   Drug use: No   Sexual activity: Not on file  Other Topics Concern   Not on file  Social History Narrative   Not on file   Social Determinants of Health   Financial Resource Strain: Not on file  Food Insecurity: Not on file  Transportation Needs: Not on file  Physical Activity: Not on file  Stress: Not on file  Social Connections: Not on file  Intimate Partner Violence: Not on file     Review of Systems: All other systems reviewed and are otherwise negative except as noted above.  Physical  Exam: There were no vitals filed for this visit.  GEN- The patient is well appearing, alert and oriented x 3 today.   HEENT: normocephalic, atraumatic; sclera clear, conjunctiva pink; hearing intact; oropharynx clear; neck supple, no JVP Lymph- no cervical lymphadenopathy Lungs- Clear to ausculation bilaterally, normal work of breathing.  No wheezes, rales, rhonchi Heart- Regular rate and rhythm, no murmurs, rubs or gallops, PMI not laterally displaced GI- soft, non-tender, non-distended, bowel sounds present, no hepatosplenomegaly Extremities- no clubbing, cyanosis, or edema; DP/PT/radial pulses 2+ bilaterally MS- no significant deformity or atrophy Skin- warm and dry, no rash or  lesion Psych- euthymic mood, full affect Neuro- strength and sensation are intact  EKG is ordered. Personal review of EKG from today shows NSr at 66 bpm. 1st degree AV block (stable)  Additional studies reviewed include: Previous EP office notes.   Assessment and Plan:  1.  PVCs:  ECG today shows no PVCs Minimally symptomatic He does not wish to have an ablation at this time.    2.  Coronary artery disease:  Status post RCA stent.   Denies current chest pain.   3.  HTN Stable on current regimen    4.  Hyperlipidemia: Lipitor per primary cardiology   5.  Chronic systolic heart failure due to ischemic cardiomyopathy:  EF 50-55% 03/2019  Continue ARB and BB.   Follow up with Dr. Curt Bears in 12 months   [name], [place]  04/01/21 8:38 AM

## 2021-05-04 DIAGNOSIS — Z20828 Contact with and (suspected) exposure to other viral communicable diseases: Secondary | ICD-10-CM | POA: Diagnosis not present

## 2021-05-04 DIAGNOSIS — Z1152 Encounter for screening for COVID-19: Secondary | ICD-10-CM | POA: Diagnosis not present

## 2021-05-19 DIAGNOSIS — G4733 Obstructive sleep apnea (adult) (pediatric): Secondary | ICD-10-CM | POA: Diagnosis not present

## 2021-06-13 DIAGNOSIS — Z20822 Contact with and (suspected) exposure to covid-19: Secondary | ICD-10-CM | POA: Diagnosis not present

## 2021-08-12 DIAGNOSIS — R7303 Prediabetes: Secondary | ICD-10-CM | POA: Diagnosis not present

## 2021-09-01 ENCOUNTER — Telehealth: Payer: Self-pay | Admitting: Cardiovascular Disease

## 2021-09-01 NOTE — Telephone Encounter (Signed)
Wife called back to add that he does get lightheaded when standing up. Please advise

## 2021-09-01 NOTE — Telephone Encounter (Signed)
Pt c/o BP issue: STAT if pt c/o blurred vision, one-sided weakness or slurred speech  1. What are your last 5 BP readings? 83/51; 85/54  2. Are you having any other symptoms (ex. Dizziness, headache, blurred vision, passed out)? Diarrhea, vomiting, nausea. Stomach pains  3. What is your BP issue? Patient experiencing low BP

## 2021-09-01 NOTE — Telephone Encounter (Signed)
Called patient back. Patient complaining of low BP, nausea, diarrhea, and stomach pain. Right now BP is 121/95 HR 68 and O2 92%. Patient takes his propranolol 80 and Benicar 40 mg in the evening. Encouraged patient to check his BP before he takes these medications, and if the SBP is below 110 to hold medications and call our office. Informed patient that he might have a virus or something and to keep hydrated. Informed patient that if his BP goes as low as 80/50's again to drink fluids and have a small salty snack to see if that helps. Will discuss with DOD, Dr. Caryl Comes for advisement.   Dr. Caryl Comes advised patient to hold his propranolol and benicar until his GI symptoms resolve. Encouraged patient to call his PCP about his GI symptoms. Patient verbalized understanding.

## 2021-09-05 ENCOUNTER — Other Ambulatory Visit: Payer: Self-pay | Admitting: Cardiology

## 2021-09-05 DIAGNOSIS — Z79899 Other long term (current) drug therapy: Secondary | ICD-10-CM

## 2021-09-05 DIAGNOSIS — I493 Ventricular premature depolarization: Secondary | ICD-10-CM

## 2021-09-09 ENCOUNTER — Other Ambulatory Visit: Payer: Self-pay | Admitting: Cardiology

## 2021-09-29 ENCOUNTER — Other Ambulatory Visit: Payer: Self-pay | Admitting: Cardiovascular Disease

## 2021-11-17 DIAGNOSIS — D225 Melanocytic nevi of trunk: Secondary | ICD-10-CM | POA: Diagnosis not present

## 2021-11-17 DIAGNOSIS — L821 Other seborrheic keratosis: Secondary | ICD-10-CM | POA: Diagnosis not present

## 2021-11-17 DIAGNOSIS — L814 Other melanin hyperpigmentation: Secondary | ICD-10-CM | POA: Diagnosis not present

## 2021-11-17 DIAGNOSIS — D492 Neoplasm of unspecified behavior of bone, soft tissue, and skin: Secondary | ICD-10-CM | POA: Diagnosis not present

## 2021-11-22 DIAGNOSIS — Z23 Encounter for immunization: Secondary | ICD-10-CM | POA: Diagnosis not present

## 2021-12-02 ENCOUNTER — Ambulatory Visit: Payer: Medicare Other | Attending: General Practice | Admitting: General Practice

## 2021-12-02 ENCOUNTER — Telehealth: Payer: Self-pay | Admitting: Cardiovascular Disease

## 2021-12-02 ENCOUNTER — Telehealth: Payer: Self-pay | Admitting: *Deleted

## 2021-12-02 DIAGNOSIS — Z01810 Encounter for preprocedural cardiovascular examination: Secondary | ICD-10-CM | POA: Diagnosis not present

## 2021-12-02 NOTE — Progress Notes (Signed)
CLEARANCE NOTES FAXED TO [phone] TO DR. Lacy Duverney

## 2021-12-02 NOTE — Telephone Encounter (Signed)
   Pre-operative Risk Assessment    Patient Name: Marcus Parsons  DOB: 1945/03/21 MRN: [id number]      Request for Surgical Clearance    Procedure:   Colonoscopy  Date of Surgery:  Clearance 12/07/21                                 Surgeon:  Dr. Lacy Duverney Surgeon's Group or Practice Name:  [facility] Phone number:  [phone] Fax number:  [phone]   Type of Clearance Requested:   - Medical  - Pharmacy:  Hold Prasugrel (Effient) 6 or 7 days prior to procedure   Type of Anesthesia:  Local    Additional requests/questions:  Please fax a copy of medical clearance to the surgeon's office.  Rutherford Guys Pough   12/02/2021, 3:15 PM

## 2021-12-02 NOTE — Telephone Encounter (Signed)
   Name: Marcus Parsons  DOB: 17-Mar-1945  MRN: [id number]  Primary Cardiologist: Jenkins Rouge, MD   Preoperative team, please contact this patient and set up a phone call appointment for further preoperative risk assessment. Please obtain consent and complete medication review. Thank you for your help.  I confirm that guidance regarding antiplatelet and oral anticoagulation therapy has been completed and, if necessary, noted below.  His Effient may be held for 6-7 days prior to his procedure.  His aspirin will need to be continued throughout his procedure.  Effient will need to be resumed once hemostasis is achieved at discretion of the surgeon.   Deberah Pelton, NP 12/02/2021, 3:33 PM [place]

## 2021-12-02 NOTE — Telephone Encounter (Signed)
Add on per med  hold for pre op clearance. Meds to be reviewed by provider, consent done.     Patient Consent for Virtual Visit        Marcus Parsons has provided verbal consent on 12/02/2021 for a virtual visit (video or telephone).   CONSENT FOR VIRTUAL VISIT FOR:  Marcus Parsons  By participating in this virtual visit I agree to the following:  I hereby voluntarily request, consent and authorize [facility] and its employed or contracted physicians, physician assistants, nurse practitioners or other licensed health care professionals (the Practitioner), to provide me with telemedicine health care services (the "Services") as deemed necessary by the treating Practitioner. I acknowledge and consent to receive the Services by the Practitioner via telemedicine. I understand that the telemedicine visit will involve communicating with the Practitioner through live audiovisual communication technology and the disclosure of certain medical information by electronic transmission. I acknowledge that I have been given the opportunity to request an in-person assessment or other available alternative prior to the telemedicine visit and am voluntarily participating in the telemedicine visit.  I understand that I have the right to withhold or withdraw my consent to the use of telemedicine in the course of my care at any time, without affecting my right to future care or treatment, and that the Practitioner or I may terminate the telemedicine visit at any time. I understand that I have the right to inspect all information obtained and/or recorded in the course of the telemedicine visit and may receive copies of available information for a reasonable fee.  I understand that some of the potential risks of receiving the Services via telemedicine include:  Delay or interruption in medical evaluation due to technological equipment failure or disruption; Information transmitted may not be sufficient (e.g. poor  resolution of images) to allow for appropriate medical decision making by the Practitioner; and/or  In rare instances, security protocols could fail, causing a breach of personal health information.  Furthermore, I acknowledge that it is my responsibility to provide information about my medical history, conditions and care that is complete and accurate to the best of my ability. I acknowledge that Practitioner's advice, recommendations, and/or decision may be based on factors not within their control, such as incomplete or inaccurate data provided by me or distortions of diagnostic images or specimens that may result from electronic transmissions. I understand that the practice of medicine is not an exact science and that Practitioner makes no warranties or guarantees regarding treatment outcomes. I acknowledge that a copy of this consent can be made available to me via my patient portal ([facility]), or I can request a printed copy by calling the office of [facility].    I understand that my insurance will be billed for this visit.   I have read or had this consent read to me. I understand the contents of this consent, which adequately explains the benefits and risks of the Services being provided via telemedicine.  I have been provided ample opportunity to ask questions regarding this consent and the Services and have had my questions answered to my satisfaction. I give my informed consent for the services to be provided through the use of telemedicine in my medical care

## 2021-12-02 NOTE — Progress Notes (Signed)
Virtual Visit via Telephone Note   Because of Marcus Parsons's co-morbid illnesses, he is at least at moderate risk for complications without adequate follow up.  This format is felt to be most appropriate for this patient at this time.  The patient did not have access to video technology/had technical difficulties with video requiring transitioning to audio format only (telephone).  All issues noted in this document were discussed and addressed.  No physical exam could be performed with this format.  Please refer to the patient's chart for his consent to telehealth for [facility].  Evaluation Performed:  Preoperative cardiovascular risk assessment _____________   Date:  12/02/2021   Patient ID:  Marcus, Parsons 10-27-1945, MRN [id number] Patient Location:  Home Provider location:   Office  Primary Care Provider:  Lujean Amel, MD Primary Cardiologist:  Jenkins Rouge, MD  Chief Complaint / Patient Profile   76 y.o. y/o male with a h/o CAD, HTN, HLD who is pending colonoscopy and presents today for telephonic preoperative cardiovascular risk assessment.  Past Medical History    Past Medical History:  Diagnosis Date   Acute myocardial infarction ([place]) 04/2013   INFERIOR   Atherosclerosis of coronary artery    Benign prostatic hypertrophy    Carotid stenosis    Hyperlipidemia    Obstructive sleep apnea    S/P coronary artery stent placement    Past Surgical History:  Procedure Laterality Date   HERNIA REPAIR     KNEE SURGERY     AS CHILD   LEFT HEART CATH AND CORONARY ANGIOGRAPHY N/A 04/24/2018   Procedure: LEFT HEART CATH AND CORONARY ANGIOGRAPHY;  Surgeon: Martinique, Peter M, MD;  Location: [facility];  Service: Cardiovascular;  Laterality: N/A;    Allergies  Allergies  Allergen Reactions   Plavix [Clopidogrel Bisulfate] Rash    History of Present Illness    Marcus Parsons is a 76 y.o. male who presents via audio/video conferencing for a  telehealth visit today.  Pt was last seen in cardiology clinic on 04/01/2021 by Oda Kilts, PA-C.  At that time Marcus Parsons was doing well .  The patient is now pending procedure as outlined above. Since his last visit, he remains stable from a cardiac standpoint  Today he denies chest pain, shortness of breath, lower extremity edema, fatigue, palpitations, melena, hematuria, hemoptysis, diaphoresis, weakness, presyncope, syncope, orthopnea, and PND.    Home Medications    Prior to Admission medications   Medication Sig Start Date End Date Taking? Authorizing Provider  aspirin EC 81 MG tablet Take 81 mg by mouth daily.    [provider]  atorvastatin (LIPITOR) 80 MG tablet TAKE 1 TABLET AT BEDTIME 09/09/21   [name], NP  cholecalciferol (VITAMIN D3) 25 MCG (1000 UT) tablet Take 1,000 Units by mouth daily.    [provider]  dutasteride (AVODART) 0.5 MG capsule Take 1 capsule (0.5 mg total) by mouth daily. 05/08/18   Josue Hector, MD  mexiletine (MEXITIL) 250 MG capsule TAKE 1 CAPSULE TWICE A DAY 09/05/21   Camnitz, Will Hassell Done, MD  nitroGLYCERIN (NITROSTAT) 0.4 MG SL tablet DISSOLVE 1 TABLET UNDER THE TONGUE EVERY 5 MINUTES AS NEEDED FOR CHEST PAIN 08/11/19   [name], NP  olmesartan (BENICAR) 40 MG tablet TAKE ONE-HALF (1/2) TABLET DAILY 12/13/20   Josue Hector, MD  prasugrel (EFFIENT) 10 MG TABS tablet TAKE 1 TABLET DAILY 09/09/21   [name], NP  PRESCRIPTION MEDICATION  CPAP: At bedtime    [provider]  Probiotic Product (PROBIOTIC DAILY) CAPS Take 1 capsule by mouth daily. 11/15/15   Josue Hector, MD  propranolol ER (INDERAL LA) 80 MG 24 hr capsule TAKE 1 CAPSULE DAILY 09/29/21   Josue Hector, MD  tamsulosin (FLOMAX) 0.4 MG CAPS capsule Take 0.4 mg by mouth daily.    [provider]    Physical Exam    Vital Signs:  Marcus Parsons does not have vital signs available for review today.  Given telephonic nature of  communication, physical exam is limited. AAOx3. NAD. Normal affect.  Speech and respirations are unlabored.  Accessory Clinical Findings    None  Assessment & Plan    1.  Preoperative Cardiovascular Risk Assessment: Colonoscopy, [facility], Dr. Lacy Duverney      Primary Cardiologist: Jenkins Rouge, MD  Chart reviewed as part of pre-operative protocol coverage. Given past medical history and time since last visit, based on ACC/AHA guidelines, Marcus Parsons would be at acceptable risk for the planned procedure without further cardiovascular testing.   Patient was advised that if he develops new symptoms prior to surgery to contact our office to arrange a follow-up appointment.  He verbalized understanding.   His Effient may be held for 6-7 days prior to his procedure.  He will need to resume Effient as soon as hemostasis is achieved.  He will need to continue aspirin throughout his procedure.  I will route this recommendation to the requesting party via Epic fax function and remove from pre-op pool.     Time:   Today, I have spent 5 minutes with the patient with telehealth technology discussing medical history, symptoms, and management plan.     Deberah Pelton, NP  12/02/2021, 4:22 PM

## 2021-12-02 NOTE — Telephone Encounter (Signed)
Add on per med  hold for pre op clearance. Meds to be reviewed by provider, consent done.

## 2021-12-07 DIAGNOSIS — Z09 Encounter for follow-up examination after completed treatment for conditions other than malignant neoplasm: Secondary | ICD-10-CM | POA: Diagnosis not present

## 2021-12-07 DIAGNOSIS — I252 Old myocardial infarction: Secondary | ICD-10-CM | POA: Diagnosis not present

## 2021-12-07 DIAGNOSIS — D123 Benign neoplasm of transverse colon: Secondary | ICD-10-CM | POA: Diagnosis not present

## 2021-12-07 DIAGNOSIS — Z9889 Other specified postprocedural states: Secondary | ICD-10-CM | POA: Diagnosis not present

## 2021-12-07 DIAGNOSIS — Z86010 Personal history of colonic polyps: Secondary | ICD-10-CM | POA: Diagnosis not present

## 2021-12-08 ENCOUNTER — Other Ambulatory Visit: Payer: Self-pay | Admitting: Cardiology

## 2021-12-09 DIAGNOSIS — D123 Benign neoplasm of transverse colon: Secondary | ICD-10-CM | POA: Diagnosis not present

## 2022-01-02 DIAGNOSIS — N400 Enlarged prostate without lower urinary tract symptoms: Secondary | ICD-10-CM | POA: Diagnosis not present

## 2022-01-09 DIAGNOSIS — R350 Frequency of micturition: Secondary | ICD-10-CM | POA: Diagnosis not present

## 2022-01-09 DIAGNOSIS — N401 Enlarged prostate with lower urinary tract symptoms: Secondary | ICD-10-CM | POA: Diagnosis not present

## 2022-01-19 NOTE — Progress Notes (Signed)
Date:  01/30/2022   ID:  Parsons, Marcus 04-04-1945, MRN [id number]  PCP:  Lujean Amel, MD  Cardiologist:  Jenkins Rouge, MD  Electrophysiologist:  Constance Haw, MD   Evaluation Performed:  Follow-Up Visit  Chief Complaint:  CAD  History of Present Illness:    Marcus Parsons is a 76 y.o. male with a hx of CAD s/p STEMI in 2015 (DES to [facility]), as well as HTN, OSA Angina in March 2020 He underwent cardiac cath 04/24/18 and patent stent in RCA but new 100% stenosed 1st diag.  Unable to intervene -medical therapy recommended.  Echo 03/19/19 with EF 50-55% 03/19/19     Pt developed rash after discharge and plavix stopped and effient added, now resolved and no issues with the Effient. 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  Has had vaccine   Past Medical History:  Diagnosis Date   Acute myocardial infarction ([employer]) 04/2013   INFERIOR   Atherosclerosis of coronary artery    Benign prostatic hypertrophy    Carotid stenosis    Hyperlipidemia    Obstructive sleep apnea    S/P coronary artery stent placement    Past Surgical History:  Procedure Laterality Date   HERNIA REPAIR     KNEE SURGERY     AS CHILD   LEFT HEART CATH AND CORONARY ANGIOGRAPHY N/A 04/24/2018   Procedure: LEFT HEART CATH AND CORONARY ANGIOGRAPHY;  Surgeon: Martinique, Davontay Watlington M, MD;  Location:  [facility];  Service: Cardiovascular;  Laterality: N/A;     Current Meds  Medication Sig   aspirin EC 81 MG tablet Take 81 mg by mouth daily.   atorvastatin (LIPITOR) 80 MG tablet Take 1 tablet (80 mg total) by mouth at bedtime. Please attend scheduled appointment for additional refills.   cholecalciferol (VITAMIN D3) 25 MCG (1000 UT) tablet Take 1,000 Units by mouth daily.   dutasteride (AVODART) 0.5 MG capsule Take 1 capsule (0.5 mg total) by mouth daily.   mexiletine (MEXITIL) 250 MG capsule TAKE 1 CAPSULE TWICE A DAY   nitroGLYCERIN (NITROSTAT) 0.4 MG SL tablet DISSOLVE 1 TABLET UNDER THE TONGUE EVERY 5 MINUTES AS NEEDED FOR CHEST PAIN   olmesartan (BENICAR) 40 MG tablet TAKE ONE-HALF (1/2) TABLET DAILY   prasugrel (EFFIENT) 10 MG TABS tablet Take 1 tablet (10 mg total) by mouth daily. Please attend scheduled appointment for additional refills.   PRESCRIPTION MEDICATION CPAP: At bedtime   Probiotic Product (PROBIOTIC DAILY) CAPS Take 1 capsule by mouth daily.   propranolol ER (INDERAL LA) 80 MG 24 hr capsule TAKE 1 CAPSULE DAILY   tamsulosin (FLOMAX) 0.4 MG CAPS capsule Take 0.4 mg by mouth daily.     Allergies:   Plavix [clopidogrel bisulfate]   Social History   Tobacco Use  Smoking status: Never   Smokeless tobacco: Never  Vaping Use   Vaping Use: Never used  Substance Use Topics   Alcohol use: No   Drug use: No     Family Hx: The patient's family history includes CAD in his paternal uncle; Dementia in his mother; Diabetes Mellitus I in his father; Heart disease in his paternal uncle; Hypertension in his father; Leukemia in his maternal uncle; Lung cancer in his father.  ROS:   Please see the history of present illness.    General:no colds or fevers, no weight changes Skin:no further rashes or ulcers HEENT:no blurred vision, no congestion CV:see HPI PUL:see HPI GI:no diarrhea constipation or melena, no indigestion GU:no hematuria, no dysuria MS:no joint  pain, no claudication Neuro:no syncope, no lightheadedness Endo:no diabetes, no thyroid disease  All other systems reviewed and are negative.   Prior CV studies:   The following studies were reviewed today:  cardiac cath 04/24/18 Previously placed Mid RCA stent (unknown type) is widely patent. Ost 1st Diag to 1st Diag lesion is 100% stenosed. LV end diastolic pressure is normal.   1. Single vessel occlusive CAD involving a small first diagonal. The stent in the mid RCA is widely patent. Diffuse coronary ectasia 2. Normal LVEDP   Plan: medical management.    Echo 04/24/18 IMPRESSIONS    1. Mild hypokinesis of the left ventricular, mid-apical inferoseptal wall, inferior wall, inferolateral wall and lateral wall.  2. The left ventricle has mildly reduced systolic function, with an ejection fraction of 45-50%. The cavity size was normal. There is moderate concentric left ventricular hypertrophy. Left ventricular diastolic Doppler parameters are consistent with  impaired relaxation.  3. The right ventricle has normal systolic function. The cavity was normal. There is no increase in right ventricular wall thickness.  4. Left atrial size was moderately dilated.  5. Mild thickening of the mitral valve leaflet. Mild calcification of the mitral valve leaflet.  6. The aortic valve is tricuspid Mild thickening of the aortic valve Mild calcification of the aortic valve. Aortic valve regurgitation is trivial by color flow Doppler.   FINDINGS  Left Ventricle: The left ventricle has mildly reduced systolic function, with an ejection fraction of 45-50%. The cavity size was normal. There is moderate concentric left ventricular hypertrophy. Left ventricular diastolic Doppler parameters are  consistent with impaired relaxation. Mild hypokinesis of the left ventricular, mid-apical inferoseptal wall, inferior wall, inferolateral wall and lateral wall. Right Ventricle: The right ventricle has normal  systolic function. The cavity was normal. There is no increase in right ventricular wall thickness. Left Atrium: left atrial size was moderately dilated Right Atrium: right atrial size was normal in size. Right atrial pressure is estimated at 3 mmHg. Interatrial Septum: No atrial level shunt detected by color flow Doppler. Pericardium: There is no evidence of pericardial effusion. Mitral Valve: The mitral valve is normal in structure. Mild thickening of the mitral valve leaflet. Mild calcification of the mitral valve leaflet. Mitral valve regurgitation is not visualized by color flow Doppler. Tricuspid Valve: The tricuspid valve is normal in structure. Tricuspid valve regurgitation is trivial by color flow Doppler. Aortic Valve: The aortic valve is tricuspid Mild thickening of the aortic valve Mild calcification of the aortic valve. Aortic valve regurgitation is trivial by color flow Doppler. Pulmonic Valve: The pulmonic valve was grossly normal. Pulmonic valve regurgitation is trivial by color flow Doppler. Venous: The inferior vena cava is normal in size with greater than 50% respiratory variability.   LEFT  VENTRICLE PLAX 2D LVIDd:         4.70 cm  Diastology LVIDs:         3.20 cm  LV e' lateral:   8.38 cm/s LV PW:         1.60 cm  LV E/e' lateral: 7.0 LV IVS:        1.70 cm  LV e' medial:    4.35 cm/s LVOT diam:     2.30 cm  LV E/e' medial:  13.4 LV SV:         61 ml LV SV Index:   24.70 LVOT Area:     4.15 cm   RIGHT VENTRICLE RVSP:           26.0 mmHg   LEFT ATRIUM           Index       RIGHT ATRIUM           Index LA diam:      5.10 cm 2.14 cm/m  RA Pressure: 3 mmHg LA Vol (A2C): 56.3 ml 23.66 ml/m RA Area:     17.30 cm LA Vol (A4C): 99.2 ml 41.68 ml/m RA Volume:   43.30 ml  18.19 ml/m  AORTIC VALVE LVOT Vmax:   116.00 cm/s LVOT Vmean:  86.800 cm/s LVOT VTI:    0.267 m  Labs/Other Tests and Data Reviewed:    EKG:  03/10/19 SR rate 20 PvC old IMI 01/30/2022 SR rate 60  normal   Recent Labs: No results found for requested labs within last 365 days.   Recent Lipid Panel Lab Results  Component Value Date/Time   CHOL 106 03/24/2020 08:53 AM   TRIG 135 03/24/2020 08:53 AM   HDL 32 (L) 03/24/2020 08:53 AM   CHOLHDL 3.3 03/24/2020 08:53 AM   CHOLHDL 3.7 04/24/2018 06:30 AM   LDLCALC 50 03/24/2020 08:53 AM    Wt Readings from Last 3 Encounters:  01/30/22 261 lb (118.4 kg)  04/01/21 256 lb 9.6 oz (116.4 kg)  09/15/20 250 lb (113.4 kg)     Objective:    Vital Signs:  BP 138/78   Pulse 60   Ht 6' (1.829 m)   Wt 261 lb (118.4 kg)   SpO2 94%   BMI 35.40 kg/m    Affect appropriate Healthy:  appears stated age HEENT: normal Neck supple with no adenopathy JVP normal no bruits no thyromegaly Lungs clear with no wheezing and good diaphragmatic motion Heart:  S1/S2 no murmur, no rub, gallop or click PMI normal Abdomen: benighn, BS positve, no tenderness, no AAA no bruit.  No HSM or HJR Distal pulses intact with no bruits No edema Neuro non-focal Skin warm and dry No muscular weakness   ASSESSMENT & PLAN:    CAD: distant RCA stent patent by last cath 04/24/18 occluded small D1 medical RX   HTN: on low side better with d/c imdur and lower dose beta blocker  HLD continue statin LDL 50 with normal LFTls 03/24/20  Cardiomyopathy : EF better TTE 03/19/19 50-55%-> update echo  PVC:  Asymptomatic burden on monitor 25% 11/15/18 continue low dose beta blocker  and mexitil f/u [place] Tremor - Now on Inderal LA 80 mg rather than Toprol   COVID-19 Education: The signs and symptoms of COVID-19 were discussed with the patient and how to seek care for testing (follow up with PCP or arrange E-visit).  The importance of social distancing was discussed today.    Medication Adjustments/Labs and Tests Ordered:  Current medicines are reviewed at length with the patient today.  Concerns regarding medicines are outlined above.   Tests Ordered: No orders of the  defined types were placed in this encounter.  Echo for EF  Medication Changes: No orders of the defined types were placed in this encounter.    Disposition:  Follow up Cam[place]itz 6 months and me in a year   Signed, Jenkins Rouge, MD  01/30/2022 8:13 AM    [place]

## 2022-01-30 ENCOUNTER — Ambulatory Visit: Payer: Medicare Other | Attending: Cardiovascular Disease | Admitting: Cardiovascular Disease

## 2022-01-30 ENCOUNTER — Encounter: Payer: Self-pay | Admitting: Cardiovascular Disease

## 2022-01-30 VITALS — BP 138/78 | HR 60 | Ht 72.0 in | Wt 261.0 lb

## 2022-01-30 DIAGNOSIS — I2510 Atherosclerotic heart disease of native coronary artery without angina pectoris: Secondary | ICD-10-CM | POA: Diagnosis not present

## 2022-01-30 DIAGNOSIS — I10 Essential (primary) hypertension: Secondary | ICD-10-CM

## 2022-01-30 DIAGNOSIS — E782 Mixed hyperlipidemia: Secondary | ICD-10-CM | POA: Diagnosis not present

## 2022-01-30 DIAGNOSIS — I429 Cardiomyopathy, unspecified: Secondary | ICD-10-CM | POA: Diagnosis not present

## 2022-01-30 NOTE — Patient Instructions (Signed)
Medication Instructions:  Your physician recommends that you continue on your current medications as directed. Please refer to the Current Medication list given to you today.         *If you need a refill on your cardiac medications before your next appointment, please call your pharmacy*  Lab Work: If you have labs (blood work) drawn today and your tests are completely normal, you will receive your results only by: [place] (if you have MyChart) OR A paper copy in the mail If you have any lab test that is abnormal or we need to change your treatment, we will call you to review the results.  Testing/Procedures: Your physician has requested that you have an echocardiogram. Echocardiography is a painless test that uses sound waves to create images of your heart. It provides your doctor with information about the size and shape of your heart and how well your heart's chambers and valves are working. This procedure takes approximately one hour. There are no restrictions for this procedure. Please do NOT wear cologne, perfume, aftershave, or lotions (deodorant is allowed). Please arrive 15 minutes prior to your appointment time.  Follow-Up: At [facility], you and your health needs are our priority.  As part of our continuing mission to provide you with exceptional heart care, we have created designated Provider Care Teams.  These Care Teams include your primary Cardiologist (physician) and Advanced Practice Providers (APPs -  Physician Assistants and Nurse Practitioners) who all work together to provide you with the care you need, when you need it.  We recommend signing up for the patient portal called "MyChart".  Sign up information is provided on this After Visit Summary.  MyChart is used to connect with patients for Virtual Visits (Telemedicine).  Patients are able to view lab/test results, encounter notes, upcoming appointments, etc.  Non-urgent messages can be sent to your  provider as well.   To learn more about what you can do with MyChart, go to [email or web address].    Your next appointment:   1 year(s)  The format for your next appointment:   In Person  Provider:   Jenkins Rouge, MD      Important Information About Sugar

## 2022-02-14 DIAGNOSIS — I10 Essential (primary) hypertension: Secondary | ICD-10-CM | POA: Diagnosis not present

## 2022-02-14 DIAGNOSIS — E7800 Pure hypercholesterolemia, unspecified: Secondary | ICD-10-CM | POA: Diagnosis not present

## 2022-02-14 DIAGNOSIS — R7303 Prediabetes: Secondary | ICD-10-CM | POA: Diagnosis not present

## 2022-02-14 DIAGNOSIS — I5022 Chronic systolic (congestive) heart failure: Secondary | ICD-10-CM | POA: Diagnosis not present

## 2022-02-14 DIAGNOSIS — I2510 Atherosclerotic heart disease of native coronary artery without angina pectoris: Secondary | ICD-10-CM | POA: Diagnosis not present

## 2022-02-14 DIAGNOSIS — Z79899 Other long term (current) drug therapy: Secondary | ICD-10-CM | POA: Diagnosis not present

## 2022-02-14 DIAGNOSIS — Z0001 Encounter for general adult medical examination with abnormal findings: Secondary | ICD-10-CM | POA: Diagnosis not present

## 2022-03-06 ENCOUNTER — Ambulatory Visit (HOSPITAL_COMMUNITY): Payer: Medicare Other | Attending: Cardiovascular Disease

## 2022-03-06 DIAGNOSIS — I2510 Atherosclerotic heart disease of native coronary artery without angina pectoris: Secondary | ICD-10-CM | POA: Diagnosis not present

## 2022-03-06 DIAGNOSIS — I429 Cardiomyopathy, unspecified: Secondary | ICD-10-CM | POA: Insufficient documentation

## 2022-03-06 DIAGNOSIS — E782 Mixed hyperlipidemia: Secondary | ICD-10-CM | POA: Diagnosis not present

## 2022-03-06 DIAGNOSIS — I10 Essential (primary) hypertension: Secondary | ICD-10-CM | POA: Diagnosis not present

## 2022-03-06 DIAGNOSIS — I428 Other cardiomyopathies: Secondary | ICD-10-CM | POA: Diagnosis not present

## 2022-03-06 LAB — ECHOCARDIOGRAM COMPLETE
Area-P 1/2: 2.83 cm2
S' Lateral: 2.9 cm

## 2022-03-08 ENCOUNTER — Other Ambulatory Visit: Payer: Self-pay | Admitting: Cardiovascular Disease

## 2022-03-27 DIAGNOSIS — E669 Obesity, unspecified: Secondary | ICD-10-CM | POA: Diagnosis not present

## 2022-03-27 DIAGNOSIS — I10 Essential (primary) hypertension: Secondary | ICD-10-CM | POA: Diagnosis not present

## 2022-03-27 DIAGNOSIS — R7303 Prediabetes: Secondary | ICD-10-CM | POA: Diagnosis not present

## 2022-03-27 DIAGNOSIS — I2510 Atherosclerotic heart disease of native coronary artery without angina pectoris: Secondary | ICD-10-CM | POA: Diagnosis not present

## 2022-03-27 DIAGNOSIS — Z6835 Body mass index (BMI) 35.0-35.9, adult: Secondary | ICD-10-CM | POA: Diagnosis not present

## 2022-05-22 DIAGNOSIS — E7800 Pure hypercholesterolemia, unspecified: Secondary | ICD-10-CM | POA: Diagnosis not present

## 2022-05-22 DIAGNOSIS — E669 Obesity, unspecified: Secondary | ICD-10-CM | POA: Diagnosis not present

## 2022-05-22 DIAGNOSIS — I10 Essential (primary) hypertension: Secondary | ICD-10-CM | POA: Diagnosis not present

## 2022-05-22 DIAGNOSIS — I2510 Atherosclerotic heart disease of native coronary artery without angina pectoris: Secondary | ICD-10-CM | POA: Diagnosis not present

## 2022-05-22 DIAGNOSIS — Z6834 Body mass index (BMI) 34.0-34.9, adult: Secondary | ICD-10-CM | POA: Diagnosis not present

## 2022-05-25 DIAGNOSIS — G4733 Obstructive sleep apnea (adult) (pediatric): Secondary | ICD-10-CM | POA: Diagnosis not present

## 2022-05-31 ENCOUNTER — Other Ambulatory Visit: Payer: Self-pay | Admitting: Cardiology

## 2022-05-31 DIAGNOSIS — Z79899 Other long term (current) drug therapy: Secondary | ICD-10-CM

## 2022-05-31 DIAGNOSIS — I493 Ventricular premature depolarization: Secondary | ICD-10-CM

## 2022-06-12 ENCOUNTER — Telehealth: Payer: Self-pay | Admitting: Cardiology

## 2022-06-12 NOTE — Telephone Encounter (Signed)
Pt aware to keep next months overdue follow up appt.   Informed that can send in 90 days at that appt. Patient verbalized understanding and agreeable to plan.

## 2022-06-12 NOTE — Telephone Encounter (Signed)
Pt c/o medication issue:  1. Name of Medication: Mexiletine   2. How are you currently taking this medication (dosage and times per day)? 1 capsule twice daily  3. Are you having a reaction (difficulty breathing--STAT)? no  4. What is your medication issue? Pt says that a 30 day refill was sent into [facility] but it was supposed to be a 90 day

## 2022-06-12 NOTE — Telephone Encounter (Signed)
Left message to call back  

## 2022-06-14 ENCOUNTER — Ambulatory Visit: Payer: Medicare Other | Admitting: Student

## 2022-06-21 DIAGNOSIS — E119 Type 2 diabetes mellitus without complications: Secondary | ICD-10-CM | POA: Diagnosis not present

## 2022-06-21 DIAGNOSIS — I2510 Atherosclerotic heart disease of native coronary artery without angina pectoris: Secondary | ICD-10-CM | POA: Diagnosis not present

## 2022-06-21 DIAGNOSIS — Z79899 Other long term (current) drug therapy: Secondary | ICD-10-CM | POA: Diagnosis not present

## 2022-07-06 NOTE — Progress Notes (Signed)
Cardiology Office Note Date:  07/06/2022  Patient ID:  Marcus Parsons, Marcus Parsons 1945-12-25, MRN [id number] PCP:  Darrow Bussing, MD  Cardiologist:  Dr. Eden Emms Electrophysiologist: Dr. Elberta Fortis    Chief Complaint:  annual  History of Present Illness: Marcus Parsons is a 77 y.o. male with history of CAD (STEMI 2015, PCI RCA, March 2020 He underwent cardiac cath 04/24/18 and patent stent in RCA but new 100% stenosed 1st diag.  Unable to intervene -medical therapy recommended), HTN, HLD, PVCs, OSA w/CPAP, tremor  He saw Dr. Elberta Fortis 09/16/19, tolerating mexiletine, doing well, no symptoms, did not want to pursue ablation  He saw Mardelle Matte 04/01/21, continued to feel well, no PVCs on his EKG, no symptoms, no changes made  He has seen Dr. Glena Norfolk as well, most recently saw Dr. Eden Emms 01/30/22, doing well, no changes were made.  TODAY He feels well Was never aware of his PVCs so not sure he would know if he was having them or not Denies any CP, palpitations or exertional intolerances Is sedentary, but no difficulties with ADLs No dizzy spells, near syncope or syncope. No SOB  Mexiletine is expensive for him  He is moving to [place] in 3 weeks    AAD Mexiletine started Oct 2020   Past Medical History:  Diagnosis Date   Acute myocardial infarction [facility]) 04/2013   INFERIOR   Atherosclerosis of coronary artery    Benign prostatic hypertrophy    Carotid stenosis    Hyperlipidemia    Obstructive sleep apnea    S/P coronary artery stent placement     Past Surgical History:  Procedure Laterality Date   HERNIA REPAIR     KNEE SURGERY     AS CHILD   LEFT HEART CATH AND CORONARY ANGIOGRAPHY N/A 04/24/2018   Procedure: LEFT HEART CATH AND CORONARY ANGIOGRAPHY;  Surgeon: [place], Peter M, MD;  Location: [facility];  Service: Cardiovascular;  Laterality: N/A;    Current Outpatient Medications  Medication Sig Dispense Refill   aspirin EC 81 MG tablet Take 81 mg by mouth daily.      atorvastatin (LIPITOR) 80 MG tablet TAKE 1 TABLET AT BEDTIME (ATTEND SCHEDULED APPOINTMENT FOR ADDITIONAL REFILLS) 90 tablet 3   cholecalciferol (VITAMIN D3) 25 MCG (1000 UT) tablet Take 1,000 Units by mouth daily.     dutasteride (AVODART) 0.5 MG capsule Take 1 capsule (0.5 mg total) by mouth daily. 90 capsule 3   mexiletine (MEXITIL) 250 MG capsule TAKE 1 CAPSULE TWICE A DAY 60 capsule 0   nitroGLYCERIN (NITROSTAT) 0.4 MG SL tablet DISSOLVE 1 TABLET UNDER THE TONGUE EVERY 5 MINUTES AS NEEDED FOR CHEST PAIN 25 tablet 6   olmesartan (BENICAR) 40 MG tablet TAKE ONE-HALF (1/2) TABLET DAILY 45 tablet 3   prasugrel (EFFIENT) 10 MG TABS tablet TAKE 1 TABLET DAILY. PLEASE ATTEND SCHEDULED APPOINTMENT FOR ADDITIONAL REFILLS 90 tablet 3   PRESCRIPTION MEDICATION CPAP: At bedtime     Probiotic Product (PROBIOTIC DAILY) CAPS Take 1 capsule by mouth daily. 90 capsule 3   propranolol ER (INDERAL LA) 80 MG 24 hr capsule TAKE 1 CAPSULE DAILY 90 capsule 3   tamsulosin (FLOMAX) 0.4 MG CAPS capsule Take 0.4 mg by mouth daily.     No current facility-administered medications for this visit.    Allergies:   Plavix [clopidogrel bisulfate]   Social History:  The patient  reports that he has never smoked. He has never used smokeless tobacco. He reports that he does not  drink alcohol and does not use drugs.   Family History:  The patient's family history includes CAD in his paternal uncle; Dementia in his mother; Diabetes Mellitus I in his father; Heart disease in his paternal uncle; Hypertension in his father; Leukemia in his maternal uncle; Lung cancer in his father.  ROS:  Please see the history of present illness.    All other systems are reviewed and otherwise negative.   PHYSICAL EXAM:  VS:  There were no vitals taken for this visit. BMI: There is no height or weight on file to calculate BMI. Well nourished, well developed, in no acute distress HEENT: normocephalic, atraumatic Neck: no JVD, carotid bruits  or masses Cardiac:  RRR; no significant murmurs, no rubs, or gallops Lungs:  CTA b/l, no wheezing, rhonchi or rales Abd: soft, nontender MS: no deformity or atrophy Ext: no edema Skin: warm and dry, no rash Neuro:  No gross deficits appreciated Psych: euthymic mood, full affect    EKG:  Done today and reviewed by myself shows  SR 62bpm, 1st degree AVblock No PVCs   03/06/22: TTE  1. Left ventricular ejection fraction, by estimation, is 55 to 60%. The  left ventricle has normal function. The left ventricle has no regional  wall motion abnormalities. There is mild asymmetric left ventricular  hypertrophy of the basal-septal segment.  Left ventricular diastolic parameters are consistent with Grade I  diastolic dysfunction (impaired relaxation).   2. Right ventricular systolic function is normal. The right ventricular  size is normal. There is normal pulmonary artery systolic pressure. The  estimated right ventricular systolic pressure is 25.8 mmHg.   3. Left atrial size was mildly dilated.   4. The mitral valve is grossly normal. Trivial mitral valve  regurgitation. No evidence of mitral stenosis.   5. The aortic valve is tricuspid. Aortic valve regurgitation is mild. No  aortic stenosis is present.   6. The inferior vena cava is normal in size with greater than 50%  respiratory variability, suggesting right atrial pressure of 3 mmHg.    LHC 04/24/18  Previously placed Mid RCA stent (unknown type) is widely patent. Ost 1st Diag to 1st Diag lesion is 100% stenosed. LV end diastolic pressure is normal.   1. Single vessel occlusive CAD involving a small first diagonal. The stent in the mid RCA is widely patent. Diffuse coronary ectasia 2. Normal LVEDP   TTE 03/19/19  1. Left ventricular ejection fraction, by visual estimation, is 50 to  55%. The left ventricle has low normal function. There is no left  ventricular hypertrophy.   2. Left ventricular diastolic parameters are  consistent with Grade I  diastolic dysfunction (impaired relaxation).   3. The left ventricle has no regional wall motion abnormalities.   4. Global right ventricle has normal systolic function.The right  ventricular size is normal. No increase in right ventricular wall  thickness.   5. Left atrial size was severely dilated.   6. Right atrial size was normal.   7. The mitral valve is normal in structure. Mild mitral valve  regurgitation. No evidence of mitral stenosis.   8. The tricuspid valve is normal in structure.   9. The tricuspid valve is normal in structure. Tricuspid valve  regurgitation is not demonstrated.  10. The aortic valve is normal in structure. Aortic valve regurgitation is  trivial. No evidence of aortic valve sclerosis or stenosis.  11. The pulmonic valve was normal in structure. Pulmonic valve  regurgitation is trivial.  12. There is borderline dilatation of the ascending aorta measuring 38 mm.  13. Normal pulmonary artery systolic pressure.  14. The inferior vena cava is normal in size with greater than 50%  respiratory variability, suggesting right atrial pressure of 3 mmHg.    Cardiac monitor 11/28/18 NSR average HR 71 PAC;s/Atrial arrhythmias <1% PVC;s frequent minimal NSVT  PVC;s high burden 24.6% of beats    Recent Labs: No results found for requested labs within last 365 days.  No results found for requested labs within last 365 days.   CrCl cannot be calculated (Patient's most recent lab result is older than the maximum 21 days allowed.).   Wt Readings from Last 3 Encounters:  01/30/22 261 lb (118.4 kg)  04/01/21 256 lb 9.6 oz (116.4 kg)  09/15/20 250 lb (113.4 kg)     Other studies reviewed: Additional studies/records reviewed today include: summarized above  ASSESSMENT AND PLAN:  PVCs None on his EKG today Mexiletine  He would like to come off Mexiletine for financial reasons, though I suggested that he not make any changes until his  move is complete and he is established with a cardiologist there to guide his medication management  CAD No anginal symptoms On ASA/effient, statin, BB  HTN Looks good  He is moving to [place] in 3 weeks Not yet lined up PMD or cardiology there Urged to make that a priority We remain available for him should he return to , need or records,   Disposition: F/u with [place] PRN  Current medicines are reviewed at length with the patient today.  The patient did not have any concerns regarding medicines.  Norma Fredrickson, PA-C 07/06/2022 1:08 PM     [facility] [address] [place] [address] [phone] (office)  [phone] (fax)

## 2022-07-07 ENCOUNTER — Encounter: Payer: Self-pay | Admitting: Physician Assistant

## 2022-07-07 ENCOUNTER — Ambulatory Visit: Payer: Medicare Other | Attending: Student | Admitting: Physician Assistant

## 2022-07-07 VITALS — BP 130/68 | HR 62 | Ht 72.0 in | Wt 256.0 lb

## 2022-07-07 DIAGNOSIS — I493 Ventricular premature depolarization: Secondary | ICD-10-CM

## 2022-07-07 DIAGNOSIS — I2510 Atherosclerotic heart disease of native coronary artery without angina pectoris: Secondary | ICD-10-CM

## 2022-07-07 DIAGNOSIS — I10 Essential (primary) hypertension: Secondary | ICD-10-CM

## 2022-07-07 NOTE — Patient Instructions (Signed)
Medication Instructions:    Your physician recommends that you continue on your current medications as directed. Please refer to the Current Medication list given to you today.   *If you need a refill on your cardiac medications before your next appointment, please call your pharmacy*   Lab Work: NONE ORDERED  TODAY    If you have labs (blood work) drawn today and your tests are completely normal, you will receive your results only by: MyChart Message (if you have MyChart) OR A paper copy in the mail If you have any lab test that is abnormal or we need to change your treatment, we will call you to review the results.   Testing/Procedures: NONE ORDERED  TODAY   Follow-Up: At [facility], you and your health needs are our priority.  As part of our continuing mission to provide you with exceptional heart care, we have created designated Provider Care Teams.  These Care Teams include your primary Cardiologist (physician) and Advanced Practice Providers (APPs -  Physician Assistants and Nurse Practitioners) who all work together to provide you with the care you need, when you need it.  We recommend signing up for the patient portal called "MyChart".  Sign up information is provided on this After Visit Summary.  MyChart is used to connect with patients for Virtual Visits (Telemedicine).  Patients are able to view lab/test results, encounter notes, upcoming appointments, etc.  Non-urgent messages can be sent to your provider as well.   To learn more about what you can do with MyChart, go to [email or web address].    Your next appointment:  CONTACT [facility] [phone] AS NEEDED FOR  ANY CARDIAC RELATED SYMPTOMS   Other Instructions  

## 2022-07-11 ENCOUNTER — Other Ambulatory Visit: Payer: Self-pay | Admitting: *Deleted

## 2022-07-11 DIAGNOSIS — I493 Ventricular premature depolarization: Secondary | ICD-10-CM

## 2022-07-11 DIAGNOSIS — Z79899 Other long term (current) drug therapy: Secondary | ICD-10-CM

## 2022-07-11 MED ORDER — MEXILETINE HCL 250 MG PO CAPS
250.0000 mg | ORAL_CAPSULE | Freq: Two times a day (BID) | ORAL | 3 refills | Status: AC
Start: 2022-07-11 — End: ?

## 2022-07-26 ENCOUNTER — Telehealth: Payer: Self-pay | Admitting: Cardiovascular Disease

## 2022-07-26 DIAGNOSIS — I2510 Atherosclerotic heart disease of native coronary artery without angina pectoris: Secondary | ICD-10-CM

## 2022-07-26 NOTE — Telephone Encounter (Signed)
Patient's wife states they moved and she would like to have a referral sent to new cardiology group below if possible.  [facility] [address], [place], [place] [address] Fax#: [phone]

## 2022-07-28 NOTE — Telephone Encounter (Signed)
Place referral for patient 

## 2022-09-25 ENCOUNTER — Other Ambulatory Visit: Payer: Self-pay | Admitting: Cardiovascular Disease
